# Patient Record
Sex: Female | Born: 1980 | Race: White | Hispanic: No | Marital: Single | Smoking: Never smoker
Health system: Southern US, Community
[De-identification: ages and names within clinical notes are randomized; demographics above are authoritative.]

## PROBLEM LIST (undated history)

## (undated) DIAGNOSIS — N879 Dysplasia of cervix uteri, unspecified: Secondary | ICD-10-CM

## (undated) DIAGNOSIS — B977 Papillomavirus as the cause of diseases classified elsewhere: Secondary | ICD-10-CM

## (undated) DIAGNOSIS — E119 Type 2 diabetes mellitus without complications: Secondary | ICD-10-CM

## (undated) HISTORY — DX: Dysplasia of cervix uteri, unspecified: N87.9

## (undated) HISTORY — DX: Papillomavirus as the cause of diseases classified elsewhere: B97.7

## (undated) HISTORY — DX: Type 2 diabetes mellitus without complications: E11.9

---

## 2008-04-11 DIAGNOSIS — N879 Dysplasia of cervix uteri, unspecified: Secondary | ICD-10-CM

## 2008-04-11 HISTORY — PX: LEEP: SHX91

## 2008-04-11 HISTORY — DX: Dysplasia of cervix uteri, unspecified: N87.9

## 2016-10-20 ENCOUNTER — Ambulatory Visit (INDEPENDENT_AMBULATORY_CARE_PROVIDER_SITE_OTHER): Payer: 59 | Admitting: Obstetrics and Gynecology

## 2016-10-20 ENCOUNTER — Encounter: Payer: Self-pay | Admitting: Obstetrics and Gynecology

## 2016-10-20 ENCOUNTER — Ambulatory Visit: Payer: Self-pay | Admitting: Obstetrics and Gynecology

## 2016-10-20 VITALS — BP 120/80 | HR 62 | Ht 70.0 in | Wt 239.0 lb

## 2016-10-20 DIAGNOSIS — Z8041 Family history of malignant neoplasm of ovary: Secondary | ICD-10-CM

## 2016-10-20 DIAGNOSIS — Z124 Encounter for screening for malignant neoplasm of cervix: Secondary | ICD-10-CM | POA: Diagnosis not present

## 2016-10-20 DIAGNOSIS — N6459 Other signs and symptoms in breast: Secondary | ICD-10-CM

## 2016-10-20 DIAGNOSIS — Z01419 Encounter for gynecological examination (general) (routine) without abnormal findings: Secondary | ICD-10-CM

## 2016-10-20 DIAGNOSIS — R350 Frequency of micturition: Secondary | ICD-10-CM

## 2016-10-20 DIAGNOSIS — Z1151 Encounter for screening for human papillomavirus (HPV): Secondary | ICD-10-CM | POA: Diagnosis not present

## 2016-10-20 DIAGNOSIS — N649 Disorder of breast, unspecified: Secondary | ICD-10-CM

## 2016-10-20 LAB — POCT URINALYSIS DIPSTICK
BILIRUBIN UA: NEGATIVE
Blood, UA: NEGATIVE
Glucose, UA: NEGATIVE
KETONES UA: NEGATIVE
LEUKOCYTES UA: NEGATIVE
NITRITE UA: NEGATIVE
PH UA: 6 (ref 5.0–8.0)
Protein, UA: NEGATIVE
Spec Grav, UA: 1.02 (ref 1.010–1.025)
Urobilinogen, UA: NEGATIVE E.U./dL

## 2016-10-20 NOTE — Progress Notes (Signed)
Chief Complaint  Patient presents with  . Gynecologic Exam     HPI:      Ms. Tasha Rodriguez is a 36 y.o. [id number] who LMP was Patient's last menstrual period was 10/02/2016., presents today for her NP annual examination.  Her menses are regular every 28-30 days, lasting 5 days.  Dysmenorrhea none. She does not have intermenstrual bleeding.  Sex activity: single partner, contraception - none. Conception ok. Pt taking PNVs. Last Pap: not recent  Hx of STDs: HPV, s/p LEEP 2010; neg paps since. No other hx of STDs.  There is no FH of breast cancer. There is a possible FH of ovarian cancer in her mom, although she never had chemo/rad. Question cx. Pt unsure. The patient does do self-breast exams. She notes a bump that develops on her RT areola occas that has a white d/c when squeezed, like a pimple. Sx then resolve and recur. Occas happens on LT breast.  Tobacco use: The patient denies current or previous tobacco use. Alcohol use: social drinker Exercise: moderately active  She does get adequate calcium but not Vitamin D in her diet. She complains of urinary frequency with and without good flow for several wks. She denies any dysuria/hematuria. She drinks caffeine. She sits all day at work.  Past Medical History:  Diagnosis Date  . Cervical dysplasia 2010  . HPV (human papilloma virus) infection     Past Surgical History:  Procedure Laterality Date  . CESAREAN SECTION    . LEEP  2010    Family History  Problem Relation Age of Onset  . Diabetes Mother   . Ovarian cancer Mother 34       s/p hyst, no chemo/rad; question cx  . Diabetes Father   . Liver disease Brother   . Diabetes Maternal Aunt   . Diabetes Maternal Uncle   . Diabetes Maternal Grandmother   . Diabetes Maternal Grandfather     Social History   Social History  . Marital status: Single    Spouse name: N/A  . Number of children: N/A  . Years of education: N/A   Occupational History  . Not on  file.   Social History Main Topics  . Smoking status: Never Smoker  . Smokeless tobacco: Never Used  . Alcohol use Yes  . Drug use: No  . Sexual activity: Yes    Birth control/ protection: None   Other Topics Concern  . Not on file   Social History Narrative  . No narrative on file    No current outpatient prescriptions on file.  ROS:  Review of Systems  Constitutional: Positive for fatigue. Negative for fever and unexpected weight change.  Respiratory: Negative for cough, shortness of breath and wheezing.   Cardiovascular: Negative for chest pain, palpitations and leg swelling.  Gastrointestinal: Positive for constipation. Negative for blood in stool, diarrhea, nausea and vomiting.  Endocrine: Negative for cold intolerance, heat intolerance and polyuria.  Genitourinary: Positive for frequency. Negative for dyspareunia, dysuria, flank pain, genital sores, hematuria, menstrual problem, pelvic pain, urgency, vaginal bleeding, vaginal discharge and vaginal pain.  Musculoskeletal: Negative for back pain, joint swelling and myalgias.  Skin: Negative for rash.  Neurological: Negative for dizziness, syncope, light-headedness, numbness and headaches.  Hematological: Negative for adenopathy.  Psychiatric/Behavioral: Negative for agitation, confusion, sleep disturbance and suicidal ideas. The patient is not nervous/anxious.      Objective: BP 120/80   Pulse 62   Ht 5\' 10"  (1.778 m)   Wt  239 lb (108.4 kg)   LMP 10/02/2016   BMI 34.29 kg/m    Physical Exam  Constitutional: She is oriented to person, place, and time. She appears well-developed and well-nourished.  Genitourinary: Vagina normal and uterus normal. There is no rash or tenderness on the right labia. There is no rash or tenderness on the left labia. No erythema or tenderness in the vagina. No vaginal discharge found. Right adnexum does not display mass and does not display tenderness. Left adnexum does not display mass  and does not display tenderness. Cervix does not exhibit motion tenderness or polyp. Uterus is not enlarged or tender.  Neck: Normal range of motion. No thyromegaly present.  Cardiovascular: Normal rate, regular rhythm and normal heart sounds.   No murmur heard. Pulmonary/Chest: Effort normal and breath sounds normal. Right breast exhibits inverted nipple. Right breast exhibits no mass, no nipple discharge, no skin change and no tenderness. Left breast exhibits inverted nipple. Left breast exhibits no mass, no nipple discharge, no skin change and no tenderness.  BILAT NIPPLE INVERTED AND NO D/C FROM THEM; NO AREOLAR LESIONS TODAY BUT AREA OF CONCERN FOR PT IS C/W MONTGOMERY GLAND BLOCKAGE  Abdominal: Soft. There is no tenderness. There is no guarding.  Musculoskeletal: Normal range of motion.  Neurological: She is alert and oriented to person, place, and time. No cranial nerve deficit.  Psychiatric: She has a normal mood and affect. Her behavior is normal.  Vitals reviewed.   Results: Results for orders placed or performed in visit on 10/20/16 (from the past 24 hour(s))  POCT Urinalysis Dipstick     Status: Normal   Collection Time: 10/20/16  8:58 AM  Result Value Ref Range   Color, UA yellow    Clarity, UA clear    Glucose, UA neg    Bilirubin, UA neg    Ketones, UA neg    Spec Grav, UA 1.020 1.010 - 1.025   Blood, UA neg    pH, UA 6.0 5.0 - 8.0   Protein, UA neg    Urobilinogen, UA negative 0.2 or 1.0 E.U./dL   Nitrite, UA neg    Leukocytes, UA Negative Negative    Assessment/Plan: Encounter for annual routine gynecological examination  Cervical cancer screening - Plan: IGP, Aptima HPV  Screening for HPV (human papillomavirus) - Plan: IGP, Aptima HPV  Urinary frequency - Neg dip. Decrease caffeine. F/u prn.  - Plan: POCT Urinalysis Dipstick  Breast complaint - No nipple d/c/no masses. Area of concern most c/w montgomery gland blockage. Warm compresses prn. F/u prn.    Family history of ovarian cancer - Pt to clarify dx in mom because it sounds more cx and not ovarian. If ovarian, discussed cancer genetic testing.              GYN counsel adequate intake of calcium and vitamin D     F/U  Return in about 1 year (around 10/20/2017).  Tasha Rodriguez B. Daisia Slomski, PA-C 10/20/2016 9:01 AM

## 2016-10-27 LAB — IGP, APTIMA HPV
HPV APTIMA: NEGATIVE
PAP SMEAR COMMENT: 0

## 2016-11-23 ENCOUNTER — Emergency Department
Admission: EM | Admit: 2016-11-23 | Discharge: 2016-11-23 | Disposition: A | Discharge status: Home / Self Care | Payer: 59 | Attending: Emergency Medicine | Admitting: Emergency Medicine

## 2016-11-23 ENCOUNTER — Emergency Department: Payer: 59

## 2016-11-23 ENCOUNTER — Encounter: Payer: Self-pay | Admitting: Emergency Medicine

## 2016-11-23 DIAGNOSIS — O26891 Other specified pregnancy related conditions, first trimester: Secondary | ICD-10-CM

## 2016-11-23 DIAGNOSIS — O200 Threatened abortion: Secondary | ICD-10-CM | POA: Diagnosis not present

## 2016-11-23 DIAGNOSIS — R1030 Lower abdominal pain, unspecified: Secondary | ICD-10-CM | POA: Diagnosis not present

## 2016-11-23 DIAGNOSIS — O209 Hemorrhage in early pregnancy, unspecified: Secondary | ICD-10-CM

## 2016-11-23 DIAGNOSIS — Z3A01 Less than 8 weeks gestation of pregnancy: Secondary | ICD-10-CM | POA: Insufficient documentation

## 2016-11-23 DIAGNOSIS — R102 Pelvic and perineal pain: Secondary | ICD-10-CM

## 2016-11-23 LAB — COMPREHENSIVE METABOLIC PANEL
ALT: 38 U/L (ref 14–54)
AST: 29 U/L (ref 15–41)
Albumin: 4.2 g/dL (ref 3.5–5.0)
Alkaline Phosphatase: 60 U/L (ref 38–126)
Anion gap: 8 (ref 5–15)
BUN: 12 mg/dL (ref 6–20)
CHLORIDE: 105 mmol/L (ref 101–111)
CO2: 26 mmol/L (ref 22–32)
CREATININE: 0.69 mg/dL (ref 0.44–1.00)
Calcium: 9 mg/dL (ref 8.9–10.3)
GFR calc Af Amer: >60 mL/min (ref >60)
Glucose, Bld: 105 mg/dL — ABNORMAL HIGH (ref 65–99)
Potassium: 3.8 mmol/L (ref 3.5–5.1)
Sodium: 139 mmol/L (ref 135–145)
Total Bilirubin: 0.8 mg/dL (ref 0.3–1.2)
Total Protein: 7.4 g/dL (ref 6.5–8.1)

## 2016-11-23 LAB — CBC WITH DIFFERENTIAL/PLATELET
BASOS ABS: 0.1 10*3/uL (ref 0–0.1)
Basophils Relative: 1 %
EOS PCT: 1 %
Eosinophils Absolute: 0.2 10*3/uL (ref 0–0.7)
HEMATOCRIT: 38.9 % (ref 35.0–47.0)
Hemoglobin: 13.3 g/dL (ref 12.0–16.0)
LYMPHS ABS: 2.2 10*3/uL (ref 1.0–3.6)
LYMPHS PCT: 17 %
MCH: 29.9 pg (ref 26.0–34.0)
MCHC: 34.2 g/dL (ref 32.0–36.0)
MCV: 87.6 fL (ref 80.0–100.0)
MONO ABS: 0.7 10*3/uL (ref 0.2–0.9)
MONOS PCT: 6 %
NEUTROS ABS: 9.8 10*3/uL — AB (ref 1.4–6.5)
Neutrophils Relative %: 75 %
PLATELETS: 231 10*3/uL (ref 150–440)
RBC: 4.44 MIL/uL (ref 3.80–5.20)
RDW: 12.6 % (ref 11.5–14.5)
WBC: 12.9 10*3/uL — ABNORMAL HIGH (ref 3.6–11.0)

## 2016-11-23 LAB — ABO/RH: ABO/RH(D): B POS

## 2016-11-23 LAB — HCG, QUANTITATIVE, PREGNANCY: HCG, BETA CHAIN, QUANT, S: 65 m[IU]/mL — AB (ref <5)

## 2016-11-23 NOTE — ED Notes (Signed)
Instructions reviewed.  Agrees to call [facility] today about setting up hcg testing.  Signature pad not working.  Return precautions reviewed with good understanding.

## 2016-11-23 NOTE — ED Notes (Signed)
Pt returned from US at this time.

## 2016-11-23 NOTE — ED Provider Notes (Signed)
[facility] Emergency Department Provider Note       Time seen: ----------------------------------------- 10:09 AM on 11/23/2016 -----------------------------------------     I have reviewed the triage vital signs and the nursing notes.   HISTORY   Chief Complaint Vaginal Bleeding    HPI Tasha Rodriguez is a 36 y.o. female who presents to the ED for abdominal pain and cramping with vaginal bleeding. Patient reports she's approximate [redacted] weeks pregnant. She is G3 P2 Ab0. She does describe heavy bleeding like a menstrual cycle and 4 out of 10 cramping pain in the lower abdomen.   Past Medical History:  Diagnosis Date  . Cervical dysplasia 2010  . HPV (human papilloma virus) infection     There are no active problems to display for this patient.   Past Surgical History:  Procedure Laterality Date  . CESAREAN SECTION    . LEEP  2010    Allergies Patient has no known allergies.  Social History Social History  Substance Use Topics  . Smoking status: Never Smoker  . Smokeless tobacco: Never Used  . Alcohol use Yes   Review of Systems Constitutional: Negative for fever. Eyes: Negative for vision changes ENT:  Negative for congestion, sore throat Cardiovascular: Negative for chest pain. Respiratory: Negative for shortness of breath. Gastrointestinal: Positive for abdominal pain Genitourinary: Positive for vaginal bleeding Musculoskeletal: Negative for back pain. Skin: Negative for rash. Neurological: Negative for headaches, focal weakness or numbness.  All systems negative/normal/unremarkable except as stated in the HPI  ____________________________________________   PHYSICAL EXAM:  VITAL SIGNS: ED Triage Vitals [11/23/16 0905]  Enc Vitals Group     BP (!) 128/45     Pulse Rate 69     Resp 18     Temp (!) 97.2 F (36.2 C)     Temp Source Oral     SpO2 100 %     Weight 240 lb (108.9 kg)     Height 5\' 10"  (1.778 m)     Head  Circumference      Peak Flow      Pain Score 4     Pain Loc      Pain Edu?      Excl. in GC?     Constitutional: Alert and oriented. Well appearing and in no distress. Eyes: Conjunctivae are normal. Normal extraocular movements. ENT   Head: Normocephalic and atraumatic.   Nose: No congestion/rhinnorhea.   Mouth/Throat: Mucous membranes are moist.   Neck: No stridor. Cardiovascular: Normal rate, regular rhythm. No murmurs, rubs, or gallops. Respiratory: Normal respiratory effort without tachypnea nor retractions. Breath sounds are clear and equal bilaterally. No wheezes/rales/rhonchi. Gastrointestinal: Soft and nontender. Normal bowel sounds Musculoskeletal: Nontender with normal range of motion in extremities. No lower extremity tenderness nor edema. Neurologic:  Normal speech and language. No gross focal neurologic deficits are appreciated.  Skin:  Skin is warm, dry and intact. No rash noted. Psychiatric: Mood and affect are normal. Speech and behavior are normal.   ____________________________________________  ED COURSE:  Pertinent labs & imaging results that were available during my care of the patient were reviewed by me and considered in my medical decision making (see chart for details). Patient presents for abdominal cramping and vaginal bleeding in pregnancy, we will assess with labs and imaging as indicated. Clinical Course as of Nov 24 1215  Wed Nov 23, 2016  1048 HCG, Clement Sayres, [place]: (!) 65 [MJ]    Clinical Course User Index [MJ] Lorin Mercy  D, Student-PA   Procedures ____________________________________________   LABS (pertinent positives/negatives)  Labs Reviewed  HCG, QUANTITATIVE, PREGNANCY - Abnormal; Notable for the following:       Result Value   hCG, Beta Chain, Quant, S 65 (*)    All other components within normal limits  CBC WITH DIFFERENTIAL/PLATELET - Abnormal; Notable for the following:    WBC 12.9 (*)    Neutro Abs 9.8  (*)    All other components within normal limits  COMPREHENSIVE METABOLIC PANEL - Abnormal; Notable for the following:    Glucose, Bld 105 (*)    All other components within normal limits  ABO/RH    RADIOLOGY Images were viewed by me  Pregnancy ultrasound IMPRESSION: 1. No intrauterine gestational sac seen. Findings could represent early intrauterine pregnancy, failed pregnancy, or ectopic pregnancy. Recommend follow-up quantitative B-HCG levels and follow-up US in 10-14 days. This recommendation follows SRU consensus guidelines: Diagnostic Criteria for Nonviable Pregnancy Early in the First Trimester. Malva Limes Engl J Med 2013; 829:5621-30; 369:1443-51. 2. A 6 mm hyperechoic lesion in the right ovary may represent a small dermoid. 3. Fibroid uterus. ____________________________________________  FINAL ASSESSMENT AND PLAN  Threatened miscarriage  Plan: Patient's labs and imaging were dictated above. Patient had presented for Vaginal bleeding in pregnancy and likely miscarriage. She has had heavy bleeding and ultrasound findings are negative for intrauterine gestation. She'll be encouraged to follow-up for 2 days for repeat hCG.   Emily FilbertWilliams, Aymee Fomby E, MD   Note: This note was generated in part or whole with voice recognition software. Voice recognition is usually quite accurate but there are transcription errors that can and very often do occur. I apologize for any typographical errors that were not detected and corrected.     Emily FilbertWilliams, Sharlyne Koeneman E, MD 11/23/16 [phone]

## 2016-11-23 NOTE — ED Notes (Signed)
Pt transported to US at this time. 

## 2016-11-23 NOTE — ED Triage Notes (Signed)
Pt to ed with c/o abd pain and cramping and vaginal bleeding. Pt reports she is approx [redacted] weeks pregnant.

## 2016-12-05 ENCOUNTER — Encounter: Payer: Self-pay | Admitting: Obstetrics & Gynecology

## 2016-12-05 ENCOUNTER — Ambulatory Visit (INDEPENDENT_AMBULATORY_CARE_PROVIDER_SITE_OTHER): Payer: 59 | Admitting: Obstetrics & Gynecology

## 2016-12-05 VITALS — BP 124/82 | Wt 242.0 lb

## 2016-12-05 DIAGNOSIS — O209 Hemorrhage in early pregnancy, unspecified: Secondary | ICD-10-CM | POA: Diagnosis not present

## 2016-12-05 NOTE — Patient Instructions (Signed)
Pregnancy Test Information °What is a pregnancy test? °A pregnancy test is used to detect the presence of human chorionic gonadotropin (hCG) in a sample of your urine or blood. hCG is a hormone produced by the cells of the placenta. The placenta is the organ that forms to nourish and support a developing baby. °This test requires a sample of either blood or urine. A pregnancy test determines whether you are pregnant or not. °How are pregnancy tests done? °Pregnancy tests are done using a home pregnancy test or having a blood or urine test done at your health care provider's office. °Home pregnancy tests require a urine sample. °· Most kits use a plastic testing device with a strip of paper that indicates whether there is hCG in your urine. °· Follow the test instructions very carefully. °· After you urinate on the test stick, markings will appear to let you know whether you are pregnant. °· For best results, use your first urine of the morning. That is when the concentration of hCG is highest. ° °Having a blood test to check for pregnancy requires a sample of blood drawn from a vein in your hand or arm. Your health care provider will send your sample to a lab for testing. Results of a pregnancy test will be positive or negative. °Is one type of pregnancy test better than another? °In some cases, a blood test will return a positive result even if a urine test was negative because blood tests are more sensitive. This means blood tests can detect hCG earlier than home pregnancy tests. °How accurate are home pregnancy tests? °Both types of pregnancy tests are very accurate. °· A blood test is about 98% accurate. °· When you are far enough along in your pregnancy and when used correctly, home pregnancy tests are equally accurate. ° °Can anything interfere with home pregnancy test results °It is possible for certain conditions to cause an inaccurate test result (false positive or false negative). °· A false positive is a  positive test result when you are not pregnant. This can happen if you: °? Are taking certain medicines, including anticonvulsants or tranquilizers. °? Have certain proteins in your blood. °· A false negative is a negative test result when you are pregnant. This can happen if you: °? Took the test before there was enough hCG to detect. A pregnancy test will not be positive in most women until 3-4 weeks after conception. °? Drank a lot of liquid before the test. Diluted urine samples can sometimes give an inaccurate result. °? Take certain medicines, such as water pills (diuretics) or some antihistamines. ° °What should I do if I have a positive pregnancy test? °If you have a positive pregnancy test, schedule an appointment with your health care provider. You might need additional testing to confirm the pregnancy. In the meantime, begin taking a prenatal vitamin, stop smoking, stop drinking alcohol, and do not use street drugs. °Talk to your health care provider about how to take care of yourself during your pregnancy. Ask about what to expect from the care you will need throughout pregnancy (prenatal care). °This information is not intended to replace advice given to you by your health care provider. Make sure you discuss any questions you have with your health care provider. °Document Released: 03/31/2003 Document Revised: 02/23/2016 Document Reviewed: 07/23/2013 °Elsevier Interactive Patient Education © 2017 Elsevier Inc. ° °

## 2016-12-05 NOTE — Progress Notes (Signed)
Obstetric Problem Visit   Chief Complaint: Believes she miscarried on the 15th  History of Present Illness: Patient is a 36 y.o. [id number] [redacted]w[redacted]d presenting for first trimester bleeding.  The onset of bleeding was on the 14th (13 days ago) and she bled for 3 days.  Had beta hCG in [facility] of [facility] and no US findings of concern or of pregnancy.  Still has nuasea and h/a and urin freq; no longer has breast T. Prior CS x2.  Prior LEEP. Does not tol hormone BC well so has not taken since 20.  Is bleeding equal to or greater than normal menstrual flow:  No Any recent trauma:  No Recent intercourse:  No History of prior miscarriage:  No Prior ultrasound demonstrating IUP:  No Prior ultrasound demonstrating viable IUP:  No Prior Serum HCG:  Yes RH- B+  PMHx: She  has a past medical history of Cervical dysplasia (2010) and HPV (human papilloma virus) infection. Also,  has a past surgical history that includes LEEP (2010) and Cesarean section., family history includes Diabetes in her father, maternal aunt, maternal grandfather, maternal grandmother, maternal uncle, and mother; Liver disease in her brother; Ovarian cancer (age of onset: 2) in her mother.,  reports that she has never smoked. She has never used smokeless tobacco. She reports that she drinks alcohol. She reports that she does not use drugs.  She has a current medication list which includes the following prescription(s): cholecalciferol and prenatal multivitamin. Also, has No Known Allergies.  Review of Systems  Constitutional: Negative for chills, fever and malaise/fatigue.  HENT: Negative for congestion, sinus pain and sore throat.   Eyes: Negative for blurred vision and pain.  Respiratory: Negative for cough and wheezing.   Cardiovascular: Negative for chest pain and leg swelling.  Gastrointestinal: Negative for abdominal pain, constipation, diarrhea, heartburn, nausea and vomiting.  Genitourinary: Negative for dysuria, frequency,  hematuria and urgency.  Musculoskeletal: Negative for back pain, joint pain, myalgias and neck pain.  Skin: Negative for itching and rash.  Neurological: Negative for dizziness, tremors and weakness.  Endo/Heme/Allergies: Does not bruise/bleed easily.  Psychiatric/Behavioral: Negative for depression. The patient is not nervous/anxious and does not have insomnia.     Objective: Vitals:   12/05/16 1026  BP: 124/82   Physical Exam  Constitutional: She is oriented to person, place, and time. She appears well-developed and well-nourished. No distress.  Musculoskeletal: Normal range of motion.  Neurological: She is alert and oriented to person, place, and time.  Skin: Skin is warm and dry.  Psychiatric: She has a normal mood and affect.  Vitals reviewed.   Assessment: 36 y.o. G3P2002 [redacted]w[redacted]d 1. First trimester bleeding 2 weeks ago, resolved. - Beta HCG, Quant   Plan: Problem List Items Addressed This Visit      Other   First trimester bleeding - Primary   Relevant Orders   Beta HCG, Quant      1) First trimester bleeding - incidence and clinical course of first trimester bleeding is discussed in detail with the patient today.  Approximately 1/3 of pregnancies ending in live births experienced 1st trimester bleeding.  The amount of bleeding is variable and not necessarily predictive of outcome.  Sources may be cervical or uterine.  Subchorionic hemorrhages are a frequent concurrent findings on ultrasound and are followed expectantly.  These often absorb or regress spontaneously although risk for expansion and further disruption of the utero-placental interface leading to miscarriage is possible.  There is no clearly documented benefit  to limiting or modifying activity and sexual intercourse in altering clinic course of 1st trimester bleeding.    2) Based on hCG levels, will proceed with TVUS evaluation to document viability, and if uncertain viability or absence of a demonstrable IUP  (and no previous documentation of IUP) will trend HCG levels.  3) The patient is Rh +, rhogam is therefore not indicated to decrease the risk rhesus alloimmunization.    4) Routine bleeding precautions were discussed with the patient prior the conclusion of today's visit.  5) No BC desired (hormonal, Paraguard discussed) if miscarriage  Annamarie Major, MD, [facility], [facility] 12/05/2016  10:47 AM

## 2016-12-06 ENCOUNTER — Telehealth: Payer: Self-pay | Admitting: Obstetrics & Gynecology

## 2016-12-06 LAB — BETA HCG QUANT (REF LAB): hCG Quant: <1 m[IU]/mL

## 2016-12-06 NOTE — Telephone Encounter (Signed)
Pt returning phone call for test results. Pt stated it's ok to leave detailed message if she can't get to the phone. She is aware that may not be possible but would relay the message.

## 2017-10-01 ENCOUNTER — Encounter (HOSPITAL_COMMUNITY): Payer: Self-pay

## 2018-03-19 ENCOUNTER — Encounter: Payer: Self-pay | Admitting: Certified Nurse Midwife

## 2018-03-27 ENCOUNTER — Encounter: Payer: Self-pay | Admitting: Certified Nurse Midwife

## 2018-04-03 ENCOUNTER — Encounter: Payer: Self-pay | Admitting: Certified Nurse Midwife

## 2018-04-11 NOTE — L&D Delivery Note (Signed)
° ° °    OP NOTE  Date: 10/15/2018   8:01 AM Name Tasha Rodriguez MR# [id number]  Preoperative Diagnosis: 1. Intrauterine pregnancy at [redacted]w[redacted]d Active Problems:   Decreased fetal movement affecting management of mother, antepartum   Non-reassuring fetal heart rate or rhythm affecting management of mother   Non-reassuring electronic fetal monitoring tracing  2.  Previous cesarean delivery x2 -patient desires repeat  3.  Gestational diabetes  Postoperative Diagnosis: 1. Intrauterine pregnancy at [redacted]w[redacted]d, delivered 2. Viable infant 3. Remainder same as pre-op   Procedure: 1.  Repeat low-Transverse Cesarean Section  Surgeon: Finis Bud, MD  Assistant:    Anesthesia:  Spinal   EBL: 750  ml     Findings: 1) female infant, Apgar scores of 7   at 1 minute and 8   at 5 minutes and a birthweight of 70.9  ounces.    2) Normal uterus, tubes and ovaries.    Procedure:  The patient was prepped and draped in the supine position and placed under spinal anesthesia.  A transverse incision was made across the abdomen in a Pfannenstiel manner. If indicated the old scar was systematically removed with sharp dissection.  We carried the dissection down to the level of the fascia.  The fascia was incised in a curvilinear manner.  The fascia was then elevated from the rectus muscles with blunt and sharp dissection.  The rectus muscles were separated laterally exposing the peritoneum.  The peritoneum was carefully entered with care being taken to avoid bowel and bladder.  A self-retaining retractor was placed.  The visceral peritoneum was incised in a curvilinear fashion across the lower uterine segment creating a bladder flap. A transverse incision was made across the lower uterine segment and extended laterally and superiorly using the bandage scissors.  Artificial rupture membranes was performed and Clear fluid was noted.  The infant was delivered from the cephalic position.  A nuchal cord was  not present. After an appropriate time interval, the cord was doubly clamped and cut. Cord blood was obtained if required.  The infant was handed to the pediatric personnel  who then placed the infant under heat lamps where it was cleaned dried and suctioned as needed. The placenta was delivered. The hysterotomy incision was then identified on ring forceps.  The uterine cavity was cleaned with a moist lap sponge.  The hysterotomy incision was closed with a running interlocking suture of Vicryl.  Hemostasis was excellent.  Pitocin was run in the IV and the uterus was found to be firm. The posterior cul-de-sac and gutters were cleaned and inspected.  Hemostasis was noted.  The fascia was then closed with a running suture of #1 Vicryl.  Hemostasis of the subcutaneous tissues was obtained using the Bovie.  The subcutaneous tissues were closed with a running suture of 000 Vicryl.  A subcuticular suture was placed.  Steri-Strips were applied in the usual manner.  A pressure dressing was placed.  The patient went to the recovery room in stable condition.   Finis Bud, M.D. 10/15/2018 8:01 AM

## 2018-04-17 NOTE — Progress Notes (Signed)
Tasha Rodriguez presents for NOB visit. Pregnancy confirmation done [facility] 1/8/20_____.  G-4 .  P-2 0 1 2    . Pregnancy education material explained and given. _2_ cats in the home. NOB labs ordered. (TSH/HbgA1c due to Increased BMI). HIV labs and Drug screen were explained optional and she did not decline. Drug screen ordered. PNV encouraged. Genetic screening options discussed. Genetic testing:Declined.  Pt may discuss with provider. Pt. To follow up with provider in _4_ weeks.  All questions answered. Pt would like to follow with midwives.

## 2018-04-18 ENCOUNTER — Encounter: Payer: Self-pay | Admitting: Certified Nurse Midwife

## 2018-04-18 ENCOUNTER — Ambulatory Visit: Payer: Self-pay | Admitting: Certified Nurse Midwife

## 2018-04-18 VITALS — BP 103/69 | HR 84 | Ht 70.0 in | Wt 230.6 lb

## 2018-04-18 DIAGNOSIS — Z3481 Encounter for supervision of other normal pregnancy, first trimester: Secondary | ICD-10-CM

## 2018-04-18 LAB — POCT URINE PREGNANCY: Preg Test, Ur: POSITIVE — AB

## 2018-04-18 NOTE — Progress Notes (Signed)
NEW OB HISTORY AND PHYSICAL  SUBJECTIVE:       Tasha Rodriguez is a 38 y.o. [phone] female, Patient's last menstrual period was 01/23/2018 (approximate)., Estimated Date of Delivery: None noted., Unknown, presents today for establishment of Prenatal Care. She has no unusual complaints.     Gynecologic History Patient's last menstrual period was 01/23/2018 (approximate). Normal Contraception: none Last Pap:10/20/16. Results were: normal, HPB negative   Obstetric History OB History  Gravida Para Term Preterm AB Living  4 2 2   1 2   SAB TAB Ectopic Multiple Live Births  1       1    # Outcome Date GA Lbr Len/2nd Weight Sex Delivery Anes PTL Lv  4 Current           3 SAB 2018 [redacted]w[redacted]d         2 Term 2011   7 lb 5 oz (3.317 kg) F CS-Unspec  N LIV  1 Term 11/12/99   7 lb 9.6 oz (3.447 kg) F CS-Unspec       Past Medical History:  Diagnosis Date  . Cervical dysplasia 2010  . HPV (human papilloma virus) infection     Past Surgical History:  Procedure Laterality Date  . CESAREAN SECTION    . LEEP  2010    Current Outpatient Medications on File Prior to Visit  Medication Sig Dispense Refill  . cholecalciferol (VITAMIN D) 1000 units tablet Take 1,000 Units by mouth daily.    . Prenatal Vit-Fe Fumarate-FA (PRENATAL MULTIVITAMIN) TABS tablet Take 1 tablet by mouth daily at 12 noon.     No current facility-administered medications on file prior to visit.     No Known Allergies  Social History   Socioeconomic History  . Marital status: Single    Spouse name: Not on file  . Number of children: Not on file  . Years of education: Not on file  . Highest education level: Not on file  Occupational History  . Not on file  Social Needs  . Financial resource strain: Not on file  . Food insecurity:    Worry: Not on file    Inability: Not on file  . Transportation needs:    Medical: Not on file    Non-medical: Not on file  Tobacco Use  . Smoking status: Never Smoker  . Smokeless  tobacco: Never Used  Substance and Sexual Activity  . Alcohol use: Not Currently  . Drug use: No  . Sexual activity: Yes    Birth control/protection: None  Lifestyle  . Physical activity:    Days per week: Not on file    Minutes per session: Not on file  . Stress: Not on file  Relationships  . Social connections:    Talks on phone: Not on file    Gets together: Not on file    Attends religious service: Not on file    Active member of club or organization: Not on file    Attends meetings of clubs or organizations: Not on file    Relationship status: Not on file  . Intimate partner violence:    Fear of current or ex partner: Not on file    Emotionally abused: Not on file    Physically abused: Not on file    Forced sexual activity: Not on file  Other Topics Concern  . Not on file  Social History Narrative  . Not on file    Family History  Problem Relation Age of Onset  .  Diabetes Mother   . Ovarian cancer Mother 3       s/p hyst, no chemo/rad; question cx  . Diabetes Father   . Liver disease Brother   . Diabetes Maternal Aunt   . Diabetes Maternal Uncle   . Diabetes Maternal Grandmother   . Diabetes Maternal Grandfather     The following portions of the patient's history were reviewed and updated as appropriate: allergies, current medications, past OB history, past medical history, past surgical history, past family history, past social history, and problem list.  She exercises 5 days a week Denies smoking , alcohol and drugs  OBJECTIVE: Initial Physical Exam (New OB)  GENERAL APPEARANCE: alert, well appearing, in no apparent distress, oriented to person, place and time, overweight HEAD: normocephalic, atraumatic MOUTH: mucous membranes moist, pharynx normal without lesions THYROID: no thyromegaly or masses present BREASTS: no masses noted, no significant tenderness, no palpable axillary nodes, no skin changes LUNGS: clear to auscultation, no wheezes, rales or  rhonchi, symmetric air entry HEART: regular rate and rhythm, no murmurs ABDOMEN: soft, nontender, nondistended, no abnormal masses, no epigastric pain EXTREMITIES: no redness or tenderness in the calves or thighs, no edema, no limitation in range of motion, intact peripheral pulses SKIN: normal coloration and turgor, no rashes LYMPH NODES: no adenopathy palpable NEUROLOGIC: alert, oriented, normal speech, no focal findings or movement disorder noted  PELVIC EXAM EXTERNAL GENITALIA: normal appearing vulva with no masses, tenderness or lesions VAGINA: no abnormal discharge or lesions CERVIX: no lesions or cervical motion tenderness, hx of leep  UTERUS: gravid ADNEXA: no masses palpable and nontender OB EXAM PELVIMETRY: appears adequate RECTUM: exam not indicated  ASSESSMENT: Normal pregnancy  PLAN: New OB counseling: pt has history 2 previous c/s she plans to have a repeat c/s but would really like to see the midwives for her [facility].  The patient has been given an overview regarding routine prenatal care. Recommendations regarding diet, weight gain, and exercise in pregnancy were given.Prenatal testing, optional genetic testing,,carrier screening and ultrasound use in pregnancy were reviewed. Pt declines genetic testing Benefits of Breast Feeding were discussed. PT states she has inverted nipples and has never been able to get good latch so she pumped but she would like to try to nurse. The patient is encouraged to consider nursing her baby post partum.   Doreene Burke, CNM

## 2018-04-18 NOTE — Patient Instructions (Signed)

## 2018-04-19 LAB — ABO AND RH: Rh Factor: POSITIVE

## 2018-04-19 LAB — CBC WITH DIFFERENTIAL
Basophils Absolute: 0.1 10*3/uL (ref 0.0–0.2)
Basos: 1 %
EOS (ABSOLUTE): 0.1 10*3/uL (ref 0.0–0.4)
EOS: 1 %
HEMATOCRIT: 38.7 % (ref 34.0–46.6)
Hemoglobin: 12.8 g/dL (ref 11.1–15.9)
Immature Grans (Abs): 0 10*3/uL (ref 0.0–0.1)
Immature Granulocytes: 0 %
LYMPHS ABS: 1.4 10*3/uL (ref 0.7–3.1)
Lymphs: 13 %
MCH: 29.3 pg (ref 26.6–33.0)
MCHC: 33.1 g/dL (ref 31.5–35.7)
MCV: 89 fL (ref 79–97)
MONOS ABS: 0.4 10*3/uL (ref 0.1–0.9)
Monocytes: 4 %
NEUTROS ABS: 8.8 10*3/uL — AB (ref 1.4–7.0)
NEUTROS PCT: 81 %
RBC: 4.37 x10E6/uL (ref 3.77–5.28)
RDW: 12.1 % (ref 11.7–15.4)
WBC: 10.7 10*3/uL (ref 3.4–10.8)

## 2018-04-19 LAB — URINALYSIS, ROUTINE W REFLEX MICROSCOPIC
Bilirubin, UA: NEGATIVE
Glucose, UA: NEGATIVE
KETONES UA: NEGATIVE
LEUKOCYTES UA: NEGATIVE
Nitrite, UA: NEGATIVE
PROTEIN UA: NEGATIVE
RBC, UA: NEGATIVE
Specific Gravity, UA: 1.008 (ref 1.005–1.030)
Urobilinogen, Ur: 0.2 mg/dL (ref 0.2–1.0)
pH, UA: 7.5 (ref 5.0–7.5)

## 2018-04-19 LAB — GC/CHLAMYDIA PROBE AMP
Chlamydia trachomatis, NAA: NEGATIVE
Neisseria gonorrhoeae by PCR: NEGATIVE

## 2018-04-19 LAB — HEMOGLOBIN A1C
ESTIMATED AVERAGE GLUCOSE: 120 mg/dL
HEMOGLOBIN A1C: 5.8 % — AB (ref 4.8–5.6)

## 2018-04-19 LAB — VARICELLA ZOSTER ANTIBODY, IGG: Varicella zoster IgG: <135 {index} — ABNORMAL LOW (ref >165)

## 2018-04-19 LAB — NICOTINE SCREEN, URINE: Cotinine Ql Scrn, Ur: NEGATIVE ng/mL

## 2018-04-19 LAB — SYPHILIS: RPR W/REFLEX TO RPR TITER AND TREPONEMAL ANTIBODIES, TRADITIONAL SCREENING AND DIAGNOSIS ALGORITHM: RPR Ser Ql: NONREACTIVE

## 2018-04-19 LAB — ANTIBODY SCREEN: Antibody Screen: NEGATIVE

## 2018-04-19 LAB — HGB SOLU + RFLX FRAC: SICKLE SOLUBILITY TEST - HGBRFX: NEGATIVE

## 2018-04-19 LAB — HEPATITIS B SURFACE ANTIGEN: Hepatitis B Surface Ag: NEGATIVE

## 2018-04-19 LAB — RUBELLA SCREEN: Rubella Antibodies, IGG: 7.62 {index} (ref >0.99)

## 2018-04-19 LAB — TSH: TSH: 0.939 u[IU]/mL (ref 0.450–4.500)

## 2018-04-19 LAB — HIV ANTIBODY (ROUTINE TESTING W REFLEX): HIV Screen 4th Generation wRfx: NONREACTIVE

## 2018-04-20 LAB — URINE CULTURE

## 2018-04-23 LAB — MONITOR DRUG PROFILE 14(MW)
Amphetamine Scrn, Ur: NEGATIVE ng/mL
BARBITURATE SCREEN URINE: NEGATIVE ng/mL
BENZODIAZEPINE SCREEN, URINE: NEGATIVE ng/mL
BUPRENORPHINE, URINE: NEGATIVE ng/mL
Cocaine (Metab) Scrn, Ur: NEGATIVE ng/mL
Creatinine(Crt), U: 33.2 mg/dL (ref 20.0–300.0)
Fentanyl, Urine: NEGATIVE pg/mL
Meperidine Screen, Urine: NEGATIVE ng/mL
Methadone Screen, Urine: NEGATIVE ng/mL
OXYCODONE+OXYMORPHONE UR QL SCN: NEGATIVE ng/mL
Opiate Scrn, Ur: NEGATIVE ng/mL
PH UR, DRUG SCRN: 7 (ref 4.5–8.9)
PHENCYCLIDINE QUANTITATIVE URINE: NEGATIVE ng/mL
Propoxyphene Scrn, Ur: NEGATIVE ng/mL
SPECIFIC GRAVITY: 1.007
Tramadol Screen, Urine: NEGATIVE ng/mL

## 2018-04-23 LAB — CANNABINOID (GC/MS), URINE
CANNABINOID UR: POSITIVE — AB
Carboxy THC (GC/MS): 39 ng/mL

## 2018-04-26 ENCOUNTER — Ambulatory Visit (INDEPENDENT_AMBULATORY_CARE_PROVIDER_SITE_OTHER): Payer: Medicaid Other

## 2018-04-26 DIAGNOSIS — Z3687 Encounter for antenatal screening for uncertain dates: Secondary | ICD-10-CM | POA: Diagnosis not present

## 2018-04-26 DIAGNOSIS — Z3481 Encounter for supervision of other normal pregnancy, first trimester: Secondary | ICD-10-CM

## 2018-05-23 ENCOUNTER — Ambulatory Visit: Payer: 59 | Admitting: Certified Nurse Midwife

## 2018-05-23 ENCOUNTER — Encounter: Payer: Self-pay | Admitting: *Deleted

## 2018-05-23 VITALS — BP 121/67 | HR 92 | Wt 238.2 lb

## 2018-05-23 DIAGNOSIS — Z3493 Encounter for supervision of normal pregnancy, unspecified, third trimester: Secondary | ICD-10-CM

## 2018-05-23 LAB — POCT URINALYSIS DIPSTICK OB
Bilirubin, UA: NEGATIVE
Blood, UA: NEGATIVE
Glucose, UA: NEGATIVE
Ketones, UA: NEGATIVE
Leukocytes, UA: NEGATIVE
NITRITE UA: NEGATIVE
PH UA: 6 (ref 5.0–8.0)
POC,PROTEIN,UA: NEGATIVE
Spec Grav, UA: 1.015 (ref 1.010–1.025)
Urobilinogen, UA: 0.2 E.U./dL

## 2018-05-23 NOTE — Progress Notes (Signed)
ROB doing well. Discussed need for early glucose screen given history . She will return in as soon as she is able to complete it. Feels fluttering. Discussed anatomy scan and ROB in 3 wks. She verbalizes and agrees to plan.   Doreene Burke, CNM

## 2018-05-23 NOTE — Patient Instructions (Signed)
Glucose Tolerance Test During Pregnancy Why am I having this test? The glucose tolerance test (GTT) is done to check how your body processes sugar (glucose). This is one of several tests used to diagnose diabetes that develops during pregnancy (gestational diabetes mellitus). Gestational diabetes is a temporary form of diabetes that some women develop during pregnancy. It usually occurs during the second trimester of pregnancy and goes away after delivery. Testing (screening) for gestational diabetes usually occurs between 24 and 28 weeks of pregnancy. You may have the GTT test after having a 1-hour glucose screening test if the results from that test indicate that you may have gestational diabetes. You may also have this test if:  You have a history of gestational diabetes.  You have a history of giving birth to very large babies or have experienced repeated fetal loss (stillbirth).  You have signs and symptoms of diabetes, such as: ? Changes in your vision. ? Tingling or numbness in your hands or feet. ? Changes in hunger, thirst, and urination that are not otherwise explained by your pregnancy. What is being tested? This test measures the amount of glucose in your blood at different times during a period of 3 hours. This indicates how well your body is able to process glucose. What kind of sample is taken?  Blood samples are required for this test. They are usually collected by inserting a needle into a blood vessel. How do I prepare for this test?  For 3 days before your test, eat normally. Have plenty of carbohydrate-rich foods.  Follow instructions from your health care provider about: ? Eating or drinking restrictions on the day of the test. You may be asked to not eat or drink anything other than water (fast) starting 8-10 hours before the test. ? Changing or stopping your regular medicines. Some medicines may interfere with this test. Tell a health care provider about:  All  medicines you are taking, including vitamins, herbs, eye drops, creams, and over-the-counter medicines.  Any blood disorders you have.  Any surgeries you have had.  Any medical conditions you have. What happens during the test? First, your blood glucose will be measured. This is referred to as your fasting blood glucose, since you fasted before the test. Then, you will drink a glucose solution that contains a certain amount of glucose. Your blood glucose will be measured again 1, 2, and 3 hours after drinking the solution. This test takes about 3 hours to complete. You will need to stay at the testing location during this time. During the testing period:  Do not eat or drink anything other than the glucose solution.  Do not exercise.  Do not use any products that contain nicotine or tobacco, such as cigarettes and e-cigarettes. If you need help stopping, ask your health care provider. The testing procedure may vary among health care providers and hospitals. How are the results reported? Your results will be reported as milligrams of glucose per deciliter of blood (mg/dL) or millimoles per liter (mmol/L). Your health care provider will compare your results to normal ranges that were established after testing a large group of people (reference ranges). Reference ranges may vary among labs and hospitals. For this test, common reference ranges are:  Fasting: less than 95-105 mg/dL (5.3-5.8 mmol/L).  1 hour after drinking glucose: less than 180-190 mg/dL (10.0-10.5 mmol/L).  2 hours after drinking glucose: less than 155-165 mg/dL (8.6-9.2 mmol/L).  3 hours after drinking glucose: 140-145 mg/dL (7.8-8.1 mmol/L). What do the   results mean? Results within reference ranges are considered normal, meaning that your glucose levels are well-controlled. If two or more of your blood glucose levels are high, you may be diagnosed with gestational diabetes. If only one level is high, your health care  provider may suggest repeat testing or other tests to confirm a diagnosis. Talk with your health care provider about what your results mean. Questions to ask your health care provider Ask your health care provider, or the department that is doing the test:  When will my results be ready?  How will I get my results?  What are my treatment options?  What other tests do I need?  What are my next steps? Summary  The glucose tolerance test (GTT) is one of several tests used to diagnose diabetes that develops during pregnancy (gestational diabetes mellitus). Gestational diabetes is a temporary form of diabetes that some women develop during pregnancy.  You may have the GTT test after having a 1-hour glucose screening test if the results from that test indicate that you may have gestational diabetes. You may also have this test if you have any symptoms or risk factors for gestational diabetes.  Talk with your health care provider about what your results mean. This information is not intended to replace advice given to you by your health care provider. Make sure you discuss any questions you have with your health care provider. Document Released: 09/27/2011 Document Revised: 11/07/2016 Document Reviewed: 11/07/2016 Elsevier Interactive Patient Education  2019 Elsevier Inc.  

## 2018-05-23 NOTE — Progress Notes (Signed)
ROB- pt is doing well 

## 2018-05-25 ENCOUNTER — Other Ambulatory Visit: Payer: Self-pay

## 2018-05-25 DIAGNOSIS — Z3493 Encounter for supervision of normal pregnancy, unspecified, third trimester: Secondary | ICD-10-CM

## 2018-05-26 LAB — GLUCOSE, 1 HOUR GESTATIONAL: GESTATIONAL DIABETES SCREEN: 211 mg/dL — AB (ref 65–139)

## 2018-05-27 ENCOUNTER — Other Ambulatory Visit: Payer: Self-pay | Admitting: Certified Nurse Midwife

## 2018-05-27 DIAGNOSIS — O99810 Abnormal glucose complicating pregnancy: Secondary | ICD-10-CM

## 2018-05-27 NOTE — Progress Notes (Signed)
Early 1 hr screen 211. Order placed for lifestyles.   Doreene Burke, CNM

## 2018-05-29 ENCOUNTER — Ambulatory Visit: Payer: Self-pay | Admitting: *Deleted

## 2018-06-01 ENCOUNTER — Telehealth: Payer: Self-pay | Admitting: Dietician

## 2018-06-01 NOTE — Telephone Encounter (Signed)
Returned voicemail message from patient to schedule an appointment. Left message for her with available dates and times next week to reschedule her missed appointment from 05/29/18.

## 2018-06-13 ENCOUNTER — Encounter: Payer: Medicaid Other | Attending: Obstetrics and Gynecology | Admitting: Dietician

## 2018-06-13 ENCOUNTER — Encounter: Payer: Self-pay | Admitting: Dietician

## 2018-06-13 VITALS — Ht 69.0 in | Wt 231.8 lb

## 2018-06-13 DIAGNOSIS — Z713 Dietary counseling and surveillance: Secondary | ICD-10-CM | POA: Insufficient documentation

## 2018-06-13 DIAGNOSIS — O99810 Abnormal glucose complicating pregnancy: Secondary | ICD-10-CM | POA: Insufficient documentation

## 2018-06-13 DIAGNOSIS — O24410 Gestational diabetes mellitus in pregnancy, diet controlled: Secondary | ICD-10-CM

## 2018-06-13 DIAGNOSIS — Z6834 Body mass index (BMI) 34.0-34.9, adult: Secondary | ICD-10-CM | POA: Insufficient documentation

## 2018-06-13 NOTE — Progress Notes (Signed)
Medical Nutrition Therapy: Visit start time: 1010  end time: 1050  Assessment:  Diagnosis: Gestational diabetes Past medical history: none significant Psychosocial issues/ stress concerns: none  Preferred learning method:  . Auditory . Visual . Hands-on   Current weight: 231.8lbs Height: 5'9" EDC 10/30/18 Medications, supplements: pre-natal vitamins, vitamin D  Progress and evaluation: Patient reports she had GDM 8 years ago with previous pregnancy, with small amount of education; had elevated HbA1C of 5.8% 04/18/18 and 1-hour GTT result of 211mg /dl on 3/50/09. She reports a history of diabetes in her family. Patient was following a keto diet prior to pregnancy and had lost about 20lbs. Was previously following vegan eating pattern but was gaining weight.    Physical activity: elliptical and weight lifting 60 minutes 5x a week  Dietary Intake:  Usual eating pattern includes 3 meals and 2-3 snacks per day. Dining out frequency: 1 meals per week.  Breakfast: almonds and fruit (raspberries, blueberries), cheese; then goes to gym Snack: avocado with salsa and corn chips Lunch: [place] sandwich with spinach and tomatoes; crab sticks + veg Snack: popcorn or veg ie cucumber with ranch Supper: 7pm-- meat+ veg ie whole chicken with broccoli or brussels sprouts or carrots Snack: none; rarely ice cream Beverages: water, coffee black or with milk no sugar, mineral water flavored with lemon or unsweetened cran juice; occasional diet soda  Nutrition Care Education: Topics covered: gestational diabetes Basic nutrition: basic food groups, appropriate nutrient balance, appropriate meal and snack schedule, general nutrition guidelines    Weight control: slow weight gain during pregnancy Diabetes:  goals for BGs, appropriate meal and snack schedule, appropriate carb intake and balance, healthy carb choices, importance of protein and low-moderate fat intake; symptoms of hypoglycemia and appropriate  treatment.    Nutritional Diagnosis:  [place]-2.2 Altered nutrition-related laboratory As related to gestational diabetes.  As evidenced by patient with recent HbA1C of 5.8% and 1-hour GTT of 211mg /dl. [place]-3.3 Overweight/obesity As related to history of excess calories and inactivity.  As evidenced by patient report of overweight since childhood, with current BMI of 34 at about [redacted] weeks gestation.  Intervention:   Instruction as noted above.  Patient is making mostly healthy food choices and is limiting carbohydrate intake.   She is engaging in physical exercise on a regular basis.  No follow-up needed at this time; patient to call as needed if further RD intervention is required.    Education Materials given:  . General diet guidelines for Gestational Diabetes . Plate Planner with food lists . Goals/ instructions   Learner/ who was taught:  . Patient   Level of understanding: Marland Kitchen Verbalizes/ demonstrates competency  Demonstrated degree of understanding via:   Teach back Learning barriers: . None  Willingness to learn/ readiness for change: . Eager, change in progress   Monitoring and Evaluation:  Dietary intake, exercise, BG control, and body weight      follow up: prn

## 2018-06-13 NOTE — Patient Instructions (Signed)
   Continue with your healthy food choices and regular exercise, great job!  Monitor portions of carbohydrates, especially starchy foods.   If blood sugar drops low, drink 4oz of juice or regular soda, allow 10-15 minutes to get back to normal, then follow with a snack or meal that contains healthy carbs and protein.

## 2018-06-14 ENCOUNTER — Other Ambulatory Visit: Payer: Self-pay

## 2018-06-14 ENCOUNTER — Encounter: Payer: Self-pay | Admitting: Obstetrics and Gynecology

## 2018-06-21 ENCOUNTER — Encounter: Payer: Self-pay | Admitting: Obstetrics and Gynecology

## 2018-06-22 ENCOUNTER — Telehealth: Payer: Self-pay | Admitting: Obstetrics and Gynecology

## 2018-06-22 ENCOUNTER — Encounter: Payer: Medicaid Other | Admitting: Obstetrics and Gynecology

## 2018-06-22 NOTE — Telephone Encounter (Signed)
I called and left vm for patient to call the office at her earliest convenience to reschedule missed appointment and also schedule an anatomy scan. Please advise.

## 2018-06-27 ENCOUNTER — Other Ambulatory Visit: Payer: Self-pay

## 2018-06-27 ENCOUNTER — Ambulatory Visit (INDEPENDENT_AMBULATORY_CARE_PROVIDER_SITE_OTHER): Payer: Medicaid Other | Admitting: Obstetrics and Gynecology

## 2018-06-27 ENCOUNTER — Encounter: Payer: Self-pay | Admitting: Obstetrics and Gynecology

## 2018-06-27 VITALS — BP 98/57 | HR 98 | Wt 237.6 lb

## 2018-06-27 DIAGNOSIS — Z8632 Personal history of gestational diabetes: Secondary | ICD-10-CM | POA: Insufficient documentation

## 2018-06-27 DIAGNOSIS — Z6833 Body mass index (BMI) 33.0-33.9, adult: Secondary | ICD-10-CM

## 2018-06-27 DIAGNOSIS — Z3492 Encounter for supervision of normal pregnancy, unspecified, second trimester: Secondary | ICD-10-CM

## 2018-06-27 DIAGNOSIS — O24419 Gestational diabetes mellitus in pregnancy, unspecified control: Secondary | ICD-10-CM

## 2018-06-27 DIAGNOSIS — E669 Obesity, unspecified: Secondary | ICD-10-CM | POA: Insufficient documentation

## 2018-06-27 MED ORDER — ACCU-CHEK FASTCLIX LANCETS MISC: 1.0000 | Freq: Four times a day (QID) | 12 refills | Status: DC

## 2018-06-27 MED ORDER — GLUCOSE BLOOD VI STRP: ORAL_STRIP | 12 refills | Status: DC

## 2018-06-27 MED ORDER — BLOOD GLUCOSE METER KIT: PACK | 0 refills | Status: DC

## 2018-06-27 NOTE — Progress Notes (Signed)
ROB- pt is doing well 

## 2018-06-27 NOTE — Addendum Note (Signed)
Addended by: Rosine Beat L on: 06/27/2018 02:18 PM   Modules accepted: Orders

## 2018-06-27 NOTE — Progress Notes (Signed)
ROB- denies any concerns, is out of work due to work closed due to [employer]; still needs anatomy scan- will schedule today. Saw lifestyles but they did not give her a meter, instructed to stop by and pick one up, to send BS numbers vis MyChart and will follow up accordingly.

## 2018-06-27 NOTE — Patient Instructions (Signed)
Gestational Diabetes Mellitus, Self Care When you have gestational diabetes (gestational diabetes mellitus), you must make sure your blood sugar (glucose) stays in a healthy range. You can do this with:  Nutrition.  Exercise.  Lifestyle changes.  Medicines or insulin, if needed.  Support from your doctors and others. If you get treated for this condition, it may not hurt you or your unborn baby (fetus). If you do not get treated for this condition, it may cause problems that can hurt you or your unborn baby. If you get gestational diabetes, you are:  More likely to get it if you get pregnant again.  More likely to develop type 2 diabetes in the future. How to stay aware of blood sugar   Check your blood sugar every day while you are pregnant. Check it as often as told.  Call your doctor if your blood sugar is above your goal numbers for two tests in a row. Your doctor will set personal treatment goals for you. Generally, you should have these blood sugar levels:  Before meals, or after not eating for a long time (fasting or preprandial): at or below 95 mg/dL (5.3 mmol/L).  After meals (postprandial): ? One hour after a meal: at or below 140 mg/dL (7.8 mmol/L). ? Two hours after a meal: at or below 120 mg/dL (6.7 mmol/L).  A1c (hemoglobin A1c) level: 6-6.5%. How to manage high and low blood sugar Signs of high blood sugar High blood sugar is called hyperglycemia. Know the early signs of high blood sugar. Signs may include:  Feeling: ? Thirsty. ? Hungry. ? Very tired.  Needing to pee (urinate) more than usual.  Blurry vision. Signs of low blood sugar Low blood sugar is called hypoglycemia. This is when blood sugar is at or below 70 mg/dL (3.9 mmol/L). Signs may include:  Feeling: ? Hungry. ? Worried or nervous (anxious). ? Sweaty and clammy. ? Confused. ? Dizzy. ? Sleepy. ? Sick to your stomach (nauseous).  Having: ? A fast heartbeat. ? A headache. ? A change  in your vision. ? Tingling or no feeling (numbness) around your mouth, lips, or tongue. ? Jerky movements that you cannot control (seizure).  Having trouble with: ? Moving (coordination). ? Sleeping. ? Passing out (fainting). ? Getting upset easily (irritability). Treating low blood sugar To treat low blood sugar, eat or drink something sugary right away. If you can think clearly and swallow safely, follow the 15:15 rule:  Take 15 grams of a fast-acting carb (carbohydrate). Talk with your doctor about how much you should take.  Some fast-acting carbs are: ? Sugar tablets (glucose pills). Take 3-4 glucose pills. ? 6-8 pieces of hard candy. ? 4-6 oz (120-150 mL) of fruit juice. ? 4-6 oz (120-150 mL) of regular (not diet) soda. ? 1 Tbsp (15 mL) honey or sugar.  Check your blood sugar 15 minutes after you take the carb.  If your blood sugar is still at or below 70 mg/dL (3.9 mmol/L), take 15 grams of a carb again.  If your blood sugar does not go above 70 mg/dL (3.9 mmol/L) after 3 tries, get help right away.  After your blood sugar goes back to normal, eat a meal or a snack within 1 hour. Treating very low blood sugar If your blood sugar is at or below 54 mg/dL (3 mmol/L), you have very low blood sugar (severe hypoglycemia). This is an emergency. Do not wait to see if the symptoms will go away. Get medical help right  away. Call your local emergency services (911 in the [place] If you have very low blood sugar and you cannot eat or drink, you may need a glucagon shot (injection). A family member or friend should learn how to check your blood sugar and how to give you a glucagon shot. Ask your doctor if you need to have a glucagon shot kit at home. Follow these instructions at home: Medicine  Take your insulin and diabetes medicines as told.  If your doctor says you should take more or less insulin or medicines, do this exactly as told.  Do not run out of insulin or  medicines. Food   Make healthy food choices. These include: ? Chicken, fish, egg whites, and beans. ? Oats, whole wheat, bulgur, brown rice, quinoa, and millet. ? Fresh fruits and vegetables. ? Low-fat dairy products. ? Nuts, avocado, olive oil, and canola oil.  Meet with a food specialist (dietitian). He or she can help you make an eating plan that is right for you.  Follow instructions from your doctor about what you cannot eat or drink.  Drink enough fluid to keep your pee (urine) pale yellow.  Eat healthy snacks between healthy meals.  Keep track of carbs that you eat. Do this by reading food labels and learning food serving sizes.  Follow your sick day plan when you cannot eat or drink normally. Make this plan with your doctor so it is ready to use. Activity  Exercise for 30 or more minutes a day, or as much as your doctor recommends.  Talk with your doctor before you start a new exercise or activity. Your doctor may need to tell you to change: ? How much insulin or medicines you take. ? How much food you eat. Lifestyle  Do not drink alcohol.  Do not use any tobacco products. These include cigarettes, chewing tobacco, and e-cigarettes. If you need help quitting, ask your doctor.  Learn how to deal with stress. If you need help with this, ask your doctor. Body care  Stay up to date with your shots (immunizations).  Brush your teeth and gums two times a day. Floss one or more times a day.  Go to the dentist one or more times every 6 months.  Stay at a healthy weight while you are pregnant. General instructions  Take over-the-counter and prescription medicines only as told by your doctor.  Ask your doctor about risks of high blood pressure in pregnancy (preeclampsia and eclampsia).  Share your diabetes care plan with: ? Your work or school. ? People you live with.  Check your pee for ketones: ? When you are sick. ? As told by your doctor.  Carry a card or  wear jewelry that says you have diabetes.  Keep all follow-up visits as told by your doctor. This is important. Care after giving birth  Have your blood sugar checked 4-12 weeks after you give birth.  Get checked for diabetes one or more times during 3 years. Questions to ask your doctor  Do I need to meet with a diabetes educator?  Where can I find a support group for people with gestational diabetes? Where to find more information To learn more about diabetes, visit:  American Diabetes Association: www.diabetes.org  Centers for Disease Control and Prevention (CDC): www.cdc.gov Summary  Check your blood sugar (glucose) every day while you are pregnant. Check it as often as told.  Take your insulin and diabetes medicines as told.  Keep all follow-up visits as   told by your doctor. This is important.  Have your blood sugar checked 4-12 weeks after you give birth. This information is not intended to replace advice given to you by your health care provider. Make sure you discuss any questions you have with your health care provider. Document Released: 07/20/2015 Document Revised: 09/18/2017 Document Reviewed: 05/01/2015 Elsevier Interactive Patient Education  2019 Elsevier Inc.     

## 2018-06-28 ENCOUNTER — Telehealth: Payer: Self-pay | Admitting: Obstetrics and Gynecology

## 2018-06-28 NOTE — Telephone Encounter (Signed)
The patient called and stated that she did not receive a meter. The patient stated that she needs that to be called in for her gestational diabetes. Please advise.

## 2018-06-28 NOTE — Telephone Encounter (Signed)
refaxed rx.

## 2018-07-02 ENCOUNTER — Other Ambulatory Visit: Payer: Self-pay | Admitting: Obstetrics and Gynecology

## 2018-07-04 ENCOUNTER — Telehealth: Payer: Self-pay

## 2018-07-04 NOTE — Telephone Encounter (Signed)
The patient called back and was pre-screened for her Anatomy Scan tomorrow, and had no symptoms; however, when she was made aware of the NO VISITOR policy right now, she got upset, cried, and then hung up before I could explain the [facility] Policy to her.  The office manager, CM, was notified and then the husband called back shortly thereafter, and asked again, and KC then explained that there was a NO VISITOR policy via [place] at this time.  I did apologize to the patient twice, and then the line went dead.

## 2018-07-04 NOTE — Telephone Encounter (Signed)
LMTRC needs to be prescreened.  

## 2018-07-05 ENCOUNTER — Other Ambulatory Visit: Payer: Self-pay

## 2018-07-05 ENCOUNTER — Ambulatory Visit (INDEPENDENT_AMBULATORY_CARE_PROVIDER_SITE_OTHER): Payer: Medicaid Other

## 2018-07-05 DIAGNOSIS — Z363 Encounter for antenatal screening for malformations: Secondary | ICD-10-CM

## 2018-07-23 ENCOUNTER — Other Ambulatory Visit: Payer: Self-pay

## 2018-07-23 ENCOUNTER — Ambulatory Visit (INDEPENDENT_AMBULATORY_CARE_PROVIDER_SITE_OTHER): Payer: Medicaid Other | Admitting: Certified Nurse Midwife

## 2018-07-23 VITALS — BP 96/60 | HR 90 | Wt 229.7 lb

## 2018-07-23 DIAGNOSIS — O09899 Supervision of other high risk pregnancies, unspecified trimester: Secondary | ICD-10-CM

## 2018-07-23 DIAGNOSIS — O34219 Maternal care for unspecified type scar from previous cesarean delivery: Secondary | ICD-10-CM

## 2018-07-23 DIAGNOSIS — Z3492 Encounter for supervision of normal pregnancy, unspecified, second trimester: Secondary | ICD-10-CM

## 2018-07-23 DIAGNOSIS — Z2839 Other underimmunization status: Secondary | ICD-10-CM

## 2018-07-23 DIAGNOSIS — Z283 Underimmunization status: Secondary | ICD-10-CM

## 2018-07-23 DIAGNOSIS — Z98891 History of uterine scar from previous surgery: Secondary | ICD-10-CM

## 2018-07-23 DIAGNOSIS — O09529 Supervision of elderly multigravida, unspecified trimester: Secondary | ICD-10-CM | POA: Insufficient documentation

## 2018-07-23 DIAGNOSIS — O09522 Supervision of elderly multigravida, second trimester: Secondary | ICD-10-CM

## 2018-07-23 NOTE — Progress Notes (Signed)
Tasha Rodriguez-Doing well overall. Blood sugars are "fine" per pt, states fasting is a little higher than normal at 105-110. Encouraged protein snack prior bed. Discussed COVID restrictions and precautions. Anticipatory guidance regarding course of prenatal care. Reviewed red flag symptoms and when to call. RTC x 5 weeks for Tasha Rodriguez or sooner if needed.

## 2018-07-23 NOTE — Progress Notes (Signed)
ROB-pt is doing well, BS staying normal

## 2018-07-23 NOTE — Patient Instructions (Signed)
Third Trimester of Pregnancy    The third trimester is from week 28 through week 40 (months 7 through 9). This trimester is when your unborn baby (fetus) is growing very fast. At the end of the ninth month, the unborn baby is about 20 inches in length. It weighs about 6-10 pounds.  Follow these instructions at home:  Medicines   Take over-the-counter and prescription medicines only as told by your doctor. Some medicines are safe and some medicines are not safe during pregnancy.   Take a prenatal vitamin that contains at least 600 micrograms (mcg) of folic acid.   If you have trouble pooping (constipation), take medicine that will make your stool soft (stool softener) if your doctor approves.  Eating and drinking     Eat regular, healthy meals.   Avoid raw meat and uncooked cheese.   If you get low calcium from the food you eat, talk to your doctor about taking a daily calcium supplement.   Eat four or five small meals rather than three large meals a day.   Avoid foods that are high in fat and sugars, such as fried and sweet foods.   To prevent constipation:  ? Eat foods that are high in fiber, like fresh fruits and vegetables, whole grains, and beans.  ? Drink enough fluids to keep your pee (urine) clear or pale yellow.  Activity   Exercise only as told by your doctor. Stop exercising if you start to have cramps.   Avoid heavy lifting, wear low heels, and sit up straight.   Do not exercise if it is too hot, too humid, or if you are in a place of great height (high altitude).   You may continue to have sex unless your doctor tells you not to.  Relieving pain and discomfort   Wear a good support bra if your breasts are tender.   Take frequent breaks and rest with your legs raised if you have leg cramps or low back pain.   Take warm water baths (sitz baths) to soothe pain or discomfort caused by hemorrhoids. Use hemorrhoid cream if your doctor approves.   If you develop puffy, bulging veins (varicose  veins) in your legs:  ? Wear support hose or compression stockings as told by your doctor.  ? Raise (elevate) your feet for 15 minutes, 3-4 times a day.  ? Limit salt in your food.  Safety   Wear your seat belt when driving.   Make a list of emergency phone numbers, including numbers for family, friends, the hospital, and police and fire departments.  Preparing for your baby's arrival  To prepare for the arrival of your baby:   Take prenatal classes.   Practice driving to the hospital.   Visit the hospital and tour the maternity area.   Talk to your work about taking leave once the baby comes.   Pack your hospital bag.   Prepare the baby's room.   Go to your doctor visits.   Buy a rear-facing car seat. Learn how to install it in your car.  General instructions   Do not use hot tubs, steam rooms, or saunas.   Do not use any products that contain nicotine or tobacco, such as cigarettes and e-cigarettes. If you need help quitting, ask your doctor.   Do not drink alcohol.   Do not douche or use tampons or scented sanitary pads.   Do not cross your legs for long periods of time.   Do   not travel for long distances unless you must. Only do so if your doctor says it is okay.   Visit your dentist if you have not gone during your pregnancy. Use a soft toothbrush to brush your teeth. Be gentle when you floss.   Avoid cat litter boxes and soil used by cats. These carry germs that can cause birth defects in the baby and can cause a loss of your baby (miscarriage) or stillbirth.   Keep all your prenatal visits as told by your doctor. This is important.  Contact a doctor if:   You are not sure if you are in labor or if your water has broken.   You are dizzy.   You have mild cramps or pressure in your lower belly.   You have a nagging pain in your belly area.   You continue to feel sick to your stomach, you throw up, or you have watery poop.   You have bad smelling fluid coming from your vagina.   You have  pain when you pee.  Get help right away if:   You have a fever.   You are leaking fluid from your vagina.   You are spotting or bleeding from your vagina.   You have severe belly cramps or pain.   You lose or gain weight quickly.   You have trouble catching your breath and have chest pain.   You notice sudden or extreme puffiness (swelling) of your face, hands, ankles, feet, or legs.   You have not felt the baby move in over an hour.   You have severe headaches that do not go away with medicine.   You have trouble seeing.   You are leaking, or you are having a gush of fluid, from your vagina before you are 37 weeks.   You have regular belly spasms (contractions) before you are 37 weeks.  Summary   The third trimester is from week 28 through week 40 (months 7 through 9). This time is when your unborn baby is growing very fast.   Follow your doctor's advice about medicine, food, and activity.   Get ready for the arrival of your baby by taking prenatal classes, getting all the baby items ready, preparing the baby's room, and visiting your doctor to be checked.   Get help right away if you are bleeding from your vagina, or you have chest pain and trouble catching your breath, or if you have not felt your baby move in over an hour.  This information is not intended to replace advice given to you by your health care provider. Make sure you discuss any questions you have with your health care provider.  Document Released: 06/22/2009 Document Revised: 05/03/2016 Document Reviewed: 05/03/2016  Elsevier Interactive Patient Education  2019 [employer]  Fetal Movement Counts  Patient Name: ________________________________________________ Patient Due Date: ____________________  What is a fetal movement count?    A fetal movement count is the number of times that you feel your baby move during a certain amount of time. This may also be called a fetal kick count. A fetal movement count is recommended for every  pregnant woman. You may be asked to start counting fetal movements as early as week 28 of your pregnancy.  Pay attention to when your baby is most active. You may notice your baby's sleep and wake cycles. You may also notice things that make your baby move more. You should do a fetal movement count:   When your   baby is normally most active.   At the same time each day.  A good time to count movements is while you are resting, after having something to eat and drink.  How do I count fetal movements?  1. Find a quiet, comfortable area. Sit, or lie down on your side.  2. Write down the date, the start time and stop time, and the number of movements that you felt between those two times. Take this information with you to your health care visits.  3. For 2 hours, count kicks, flutters, swishes, rolls, and jabs. You should feel at least 10 movements during 2 hours.  4. You may stop counting after you have felt 10 movements.  5. If you do not feel 10 movements in 2 hours, have something to eat and drink. Then, keep resting and counting for 1 hour. If you feel at least 4 movements during that hour, you may stop counting.  Contact a health care provider if:   You feel fewer than 4 movements in 2 hours.   Your baby is not moving like he or she usually does.  Date: ____________ Start time: ____________ Stop time: ____________ Movements: ____________  Date: ____________ Start time: ____________ Stop time: ____________ Movements: ____________  Date: ____________ Start time: ____________ Stop time: ____________ Movements: ____________  Date: ____________ Start time: ____________ Stop time: ____________ Movements: ____________  Date: ____________ Start time: ____________ Stop time: ____________ Movements: ____________  Date: ____________ Start time: ____________ Stop time: ____________ Movements: ____________  Date: ____________ Start time: ____________ Stop time: ____________ Movements: ____________  Date: ____________ Start  time: ____________ Stop time: ____________ Movements: ____________  Date: ____________ Start time: ____________ Stop time: ____________ Movements: ____________  This information is not intended to replace advice given to you by your health care provider. Make sure you discuss any questions you have with your health care provider.  Document Released: 04/27/2006 Document Revised: 11/25/2015 Document Reviewed: 05/07/2015  Elsevier Interactive Patient Education  2019 Elsevier Inc[employer]

## 2018-07-24 ENCOUNTER — Encounter: Payer: Medicaid Other | Admitting: Certified Nurse Midwife

## 2018-08-27 ENCOUNTER — Telehealth: Payer: Self-pay

## 2018-08-27 NOTE — Telephone Encounter (Signed)
Coronavirus (COVID-19) Are you at risk?  Are you at risk for the Coronavirus (COVID-19)?  To be considered HIGH RISK for Coronavirus (COVID-19), you have to meet the following criteria:  . Traveled to [place], [place], [place], [place] or [place]; or in the [place] to [place], [place], [place], or [place]; and have fever, cough, and shortness of breath within the last 2 weeks of travel OR . Been in close contact with a person diagnosed with COVID-19 within the last 2 weeks and have fever, cough, and shortness of breath . IF YOU DO NOT MEET THESE CRITERIA, YOU ARE CONSIDERED LOW RISK FOR COVID-19.  What to do if you are HIGH RISK for COVID-19?  . If you are having a medical emergency, call 911. . Seek medical care right away. Before you go to a doctor's office, urgent care or emergency department, call ahead and tell them about your recent travel, contact with someone diagnosed with COVID-19, and your symptoms. You should receive instructions from your physician's office regarding next steps of care.  . When you arrive at healthcare provider, tell the healthcare staff immediately you have returned from visiting [place], [place], [place], [place] or [place]; or traveled in the [place] to [place], [place], [place], or [place]; in the last two weeks or you have been in close contact with a person diagnosed with COVID-19 in the last 2 weeks.   . Tell the health care staff about your symptoms: fever, cough and shortness of breath. . After you have been seen by a medical provider, you will be either: o Tested for (COVID-19) and discharged home on quarantine except to seek medical care if symptoms worsen, and asked to  - Stay home and avoid contact with others until you get your results (4-5 days)  - Avoid travel on public transportation if possible (such as bus, train, or airplane) or o Sent to the Emergency Department by EMS for evaluation, COVID-19 testing, and possible  admission depending on your condition and test results.  What to do if you are LOW RISK for COVID-19?  Reduce your risk of any infection by using the same precautions used for avoiding the common cold or flu:  . Wash your hands often with soap and warm water for at least 20 seconds.  If soap and water are not readily available, use an alcohol-based hand sanitizer with at least 60% alcohol.  . If coughing or sneezing, cover your mouth and nose by coughing or sneezing into the elbow areas of your shirt or coat, into a tissue or into your sleeve (not your hands). . Avoid shaking hands with others and consider head nods or verbal greetings only. . Avoid touching your eyes, nose, or mouth with unwashed hands.  . Avoid close contact with people who are [employer]lee Fate. . Avoid places or events with large numbers of people in one location, like concerts or sporting events. . Carefully consider travel plans you have or are making. . If you are planning any travel outside or inside the [place], visit the CDC's Travelers' Health webpage for the latest health notices. . If you have some symptoms but not all symptoms, continue to monitor at home and seek medical attention if your symptoms worsen. . If you are having a medical emergency, call 911.  08/27/18 SCREENING NEG SLS ADDITIONAL HEALTHCARE OPTIONS FOR PATIENTS  [facility] / e-Visit: [email or web address]         [facility]: [phone]    [facility]: [phone]                   [facility]: [phone]  

## 2018-08-28 ENCOUNTER — Ambulatory Visit (INDEPENDENT_AMBULATORY_CARE_PROVIDER_SITE_OTHER): Payer: Medicaid Other | Admitting: Obstetrics and Gynecology

## 2018-08-28 ENCOUNTER — Encounter: Payer: Medicaid Other | Admitting: Certified Nurse Midwife

## 2018-08-28 ENCOUNTER — Other Ambulatory Visit: Payer: Self-pay

## 2018-08-28 VITALS — BP 89/58 | HR 91 | Wt 237.7 lb

## 2018-08-28 DIAGNOSIS — Z3493 Encounter for supervision of normal pregnancy, unspecified, third trimester: Secondary | ICD-10-CM

## 2018-08-28 LAB — POCT URINALYSIS DIPSTICK OB
Bilirubin, UA: NEGATIVE
Blood, UA: NEGATIVE
Glucose, UA: NEGATIVE
Ketones, UA: NEGATIVE
Leukocytes, UA: NEGATIVE
Nitrite, UA: NEGATIVE
POC,PROTEIN,UA: NEGATIVE
Spec Grav, UA: 1.015 (ref 1.010–1.025)
Urobilinogen, UA: 0.2 E.U./dL
pH, UA: 6 (ref 5.0–8.0)

## 2018-08-28 MED ORDER — GLYBURIDE 2.5 MG PO TABS: 2.5000 mg | ORAL_TABLET | Freq: Every day | ORAL | 2 refills | Status: DC

## 2018-08-28 NOTE — Progress Notes (Signed)
ROB- reviewed glucose log, all PP normal, but FBS all > 100, recommend start of glyburide 2.5mg 

## 2018-08-28 NOTE — Progress Notes (Signed)
ROB- pt is doing well, BS log copied

## 2018-09-10 ENCOUNTER — Telehealth: Payer: Self-pay

## 2018-09-10 NOTE — Progress Notes (Signed)
ROB- Pt [redacted]w[redacted]d present today to discuss c-section. Pt stated that she is having pressure in the lower abd area. No other concerns.

## 2018-09-10 NOTE — Telephone Encounter (Signed)
Pt called no answer LM via voicemail to call the office to do prescreening.  

## 2018-09-11 ENCOUNTER — Encounter: Payer: Self-pay | Admitting: Obstetrics and Gynecology

## 2018-09-11 ENCOUNTER — Ambulatory Visit (INDEPENDENT_AMBULATORY_CARE_PROVIDER_SITE_OTHER): Payer: Medicaid Other | Admitting: Obstetrics and Gynecology

## 2018-09-11 ENCOUNTER — Other Ambulatory Visit: Payer: Self-pay

## 2018-09-11 VITALS — BP 104/59 | HR 76 | Wt 245.5 lb

## 2018-09-11 DIAGNOSIS — Z3A33 33 weeks gestation of pregnancy: Secondary | ICD-10-CM | POA: Diagnosis not present

## 2018-09-11 DIAGNOSIS — O24415 Gestational diabetes mellitus in pregnancy, controlled by oral hypoglycemic drugs: Secondary | ICD-10-CM | POA: Diagnosis not present

## 2018-09-11 DIAGNOSIS — O34219 Maternal care for unspecified type scar from previous cesarean delivery: Secondary | ICD-10-CM

## 2018-09-11 DIAGNOSIS — O09523 Supervision of elderly multigravida, third trimester: Secondary | ICD-10-CM

## 2018-09-11 DIAGNOSIS — Z98891 History of uterine scar from previous surgery: Secondary | ICD-10-CM

## 2018-09-11 DIAGNOSIS — O99210 Obesity complicating pregnancy, unspecified trimester: Secondary | ICD-10-CM

## 2018-09-11 DIAGNOSIS — O99213 Obesity complicating pregnancy, third trimester: Secondary | ICD-10-CM

## 2018-09-11 LAB — POCT URINALYSIS DIPSTICK OB
Bilirubin, UA: NEGATIVE
Blood, UA: NEGATIVE
Glucose, UA: NEGATIVE
Ketones, UA: NEGATIVE
Leukocytes, UA: NEGATIVE
Nitrite, UA: NEGATIVE
POC,PROTEIN,UA: NEGATIVE
Spec Grav, UA: 1.025 (ref 1.010–1.025)
Urobilinogen, UA: 0.2 E.U./dL
pH, UA: 6 (ref 5.0–8.0)

## 2018-09-11 NOTE — Progress Notes (Signed)
ROB: Referred from midwifery service for consultation for repeat C-section. Patient with h/o prior C-section x 2, declines vaginal attempt. Patient doing well, does feel some pressure in pelvis.  Discussed risks benefits of repeat C-section. Desires to have surgery scheduled for 10/22/2018. 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 Notes fasting blood sugars have improved (from 110s down to 80s), sometimes having hypoglycemia at night and requires a snack. Discussed need to begin antenatal testing. Will begin with once weekly. RTC in 2 weeks. Will need growth scan at 36 weeks.     NONSTRESS TEST INTERPRETATION  INDICATIONS: GDM (on glyburide) and  AMA  FHR baseline: 145 bpm RESULTS:Reactive COMMENTS: No contractions   PLAN: 1. Continue fetal kick counts twice a day. 2. Continue antepartum testing as scheduled-weekly

## 2018-09-11 NOTE — Patient Instructions (Signed)
Nonstress Test °A nonstress test is a procedure that is done during pregnancy in order to check the baby's heartbeat. The procedure can help show if the baby (fetus) is healthy. It is commonly done when: °· The baby is past his or her due date. °· The pregnancy is high risk. °· The baby is moving less than normal. °· The mother has lost a pregnancy in the past. °· The health care provider suspects a problem with the baby's growth. °· There is too much or too little amniotic fluid. °The procedure is often done in the third trimester of pregnancy to find out if an early delivery is needed and whether such a delivery is safe. °During a nonstress test, the baby's heartbeat is monitored when the baby is resting and when the baby is moving. If the baby is healthy, the heart rate will increase when he or she moves or kicks and will return to normal when he or she rests. °Tell a health care provider about: °· Any allergies you have. °· Any medical conditions you have. °· All medicines you are taking, including vitamins, herbs, eye drops, creams, and over-the-counter medicines. °What are the risks? °There are no risks to you or your baby from a nonstress test. This procedure should not be painful or uncomfortable. °What happens before the procedure? °· Eat a meal right before the test or as directed by your health care provider. Food may help encourage the baby to move. °· Use the restroom right before the test. °What happens during the procedure? °· Two monitors will be placed on your abdomen. One will record the baby's heart rate and the other will record the contractions of your uterus. °· You may be asked to lie down on your side or to sit upright. °· You may be given a button to press when you feel your baby move. °· Your health care provider will listen to your baby's heartbeat and recorded it. He or she may also watch your baby's heartbeat on a screen. °· If the baby seems to be sleeping, you may be asked to drink  some juice or soda, eat a snack, or change positions. °The procedure may vary among health care providers and hospitals. °What happens after the procedure? °· Your health care provider will discuss the test results with you and make recommendations for the future. Depending on the results, your health care provider may order additional tests or another course of action. °· If your health care provider gave you any diet or activity instructions, make sure to follow them. °· Keep all follow-up visits as told by your health care provider. This is important. °Summary °· A nonstress test is a procedure that is done during pregnancy in order to check the baby's heartbeat. The procedure can help show if the baby is healthy. °· The procedure is often done in the third trimester of pregnancy to find out if an early delivery is needed and whether such a delivery is safe. °· During a nonstress test, the baby's heartbeat is monitored when the baby is resting and when the baby is moving. If the baby is healthy, the heart rate will increase when he or she moves or kicks and will return to normal when he or she rests. °· Your health care provider will discuss the test results with you and make recommendations for the future. °This information is not intended to replace advice given to you by your health care provider. Make sure you discuss any   questions you have with your health care provider. °Document Released: 03/18/2002 Document Revised: 07/07/2016 Document Reviewed: 07/07/2016 °Elsevier Interactive Patient Education © 2019 Elsevier Inc. ° °

## 2018-09-25 ENCOUNTER — Ambulatory Visit (INDEPENDENT_AMBULATORY_CARE_PROVIDER_SITE_OTHER): Payer: Medicaid Other | Admitting: Certified Nurse Midwife

## 2018-09-25 ENCOUNTER — Encounter: Payer: Self-pay | Admitting: Certified Nurse Midwife

## 2018-09-25 ENCOUNTER — Other Ambulatory Visit: Payer: Self-pay

## 2018-09-25 ENCOUNTER — Other Ambulatory Visit: Payer: Medicaid Other

## 2018-09-25 VITALS — BP 112/66 | HR 90 | Wt 252.2 lb

## 2018-09-25 DIAGNOSIS — Z3493 Encounter for supervision of normal pregnancy, unspecified, third trimester: Secondary | ICD-10-CM

## 2018-09-25 DIAGNOSIS — O09523 Supervision of elderly multigravida, third trimester: Secondary | ICD-10-CM

## 2018-09-25 DIAGNOSIS — O09529 Supervision of elderly multigravida, unspecified trimester: Secondary | ICD-10-CM

## 2018-09-25 DIAGNOSIS — O26893 Other specified pregnancy related conditions, third trimester: Secondary | ICD-10-CM

## 2018-09-25 DIAGNOSIS — O24415 Gestational diabetes mellitus in pregnancy, controlled by oral hypoglycemic drugs: Secondary | ICD-10-CM | POA: Diagnosis not present

## 2018-09-25 DIAGNOSIS — G5600 Carpal tunnel syndrome, unspecified upper limb: Secondary | ICD-10-CM

## 2018-09-25 DIAGNOSIS — O99210 Obesity complicating pregnancy, unspecified trimester: Secondary | ICD-10-CM

## 2018-09-25 DIAGNOSIS — O99213 Obesity complicating pregnancy, third trimester: Secondary | ICD-10-CM

## 2018-09-25 DIAGNOSIS — O26899 Other specified pregnancy related conditions, unspecified trimester: Secondary | ICD-10-CM

## 2018-09-25 LAB — POCT URINALYSIS DIPSTICK OB
Bilirubin, UA: NEGATIVE
Blood, UA: NEGATIVE
Glucose, UA: NEGATIVE
Ketones, UA: NEGATIVE
Leukocytes, UA: NEGATIVE
Nitrite, UA: NEGATIVE
POC,PROTEIN,UA: NEGATIVE
Spec Grav, UA: 1.02 (ref 1.010–1.025)
Urobilinogen, UA: 0.2 E.U./dL
pH, UA: 6 (ref 5.0–8.0)

## 2018-09-25 NOTE — Patient Instructions (Signed)
Fetal Movement Counts Patient Name: ________________________________________________ Patient Due Date: ____________________ What is a fetal movement count?  A fetal movement count is the number of times that you feel your baby move during a certain amount of time. This may also be called a fetal kick count. A fetal movement count is recommended for every pregnant woman. You may be asked to start counting fetal movements as early as week 28 of your pregnancy. Pay attention to when your baby is most active. You may notice your baby's sleep and wake cycles. You may also notice things that make your baby move more. You should do a fetal movement count:  When your baby is normally most active.  At the same time each day. A good time to count movements is while you are resting, after having something to eat and drink. How do I count fetal movements? 1. Find a quiet, comfortable area. Sit, or lie down on your side. 2. Write down the date, the start time and stop time, and the number of movements that you felt between those two times. Take this information with you to your health care visits. 3. For 2 hours, count kicks, flutters, swishes, rolls, and jabs. You should feel at least 10 movements during 2 hours. 4. You may stop counting after you have felt 10 movements. 5. If you do not feel 10 movements in 2 hours, have something to eat and drink. Then, keep resting and counting for 1 hour. If you feel at least 4 movements during that hour, you may stop counting. Contact a health care provider if:  You feel fewer than 4 movements in 2 hours.  Your baby is not moving like he or she usually does. Date: ____________ Start time: ____________ Stop time: ____________ Movements: ____________ Date: ____________ Start time: ____________ Stop time: ____________ Movements: ____________ Date: ____________ Start time: ____________ Stop time: ____________ Movements: ____________ Date: ____________ Start time:  ____________ Stop time: ____________ Movements: ____________ Date: ____________ Start time: ____________ Stop time: ____________ Movements: ____________ Date: ____________ Start time: ____________ Stop time: ____________ Movements: ____________ Date: ____________ Start time: ____________ Stop time: ____________ Movements: ____________ Date: ____________ Start time: ____________ Stop time: ____________ Movements: ____________ Date: ____________ Start time: ____________ Stop time: ____________ Movements: ____________ This information is not intended to replace advice given to you by your health care provider. Make sure you discuss any questions you have with your health care provider. Document Released: 04/27/2006 Document Revised: 11/25/2015 Document Reviewed: 05/07/2015 Elsevier Interactive Patient Education  2019 [employer]. Nonstress Test A nonstress test is a procedure that is done during pregnancy in order to check the baby's heartbeat. The procedure can help show if the baby (fetus) is healthy. It is commonly done when:  The baby is past his or her due date.  The pregnancy is high risk.  The baby is moving less than normal.  The mother has lost a pregnancy in the past.  The health care provider suspects a problem with the baby's growth.  There is too much or too little amniotic fluid. The procedure is often done in the third trimester of pregnancy to find out if an early delivery is needed and whether such a delivery is safe. During a nonstress test, the baby's heartbeat is monitored when the baby is resting and when the baby is moving. If the baby is healthy, the heart rate will increase when he or she moves or kicks and will return to normal when he or she rests. Tell a health  care provider about:  Any allergies you have.  Any medical conditions you have.  All medicines you are taking, including vitamins, herbs, eye drops, creams, and over-the-counter medicines. What are the  risks? There are no risks to you or your baby from a nonstress test. This procedure should not be painful or uncomfortable. What happens before the procedure?  Eat a meal right before the test or as directed by your health care provider. Food may help encourage the baby to move.  Use the restroom right before the test. What happens during the procedure?  Two monitors will be placed on your abdomen. One will record the baby's heart rate and the other will record the contractions of your uterus.  You may be asked to lie down on your side or to sit upright.  You may be given a button to press when you feel your baby move.  Your health care provider will listen to your baby's heartbeat and recorded it. He or she may also watch your baby's heartbeat on a screen.  If the baby seems to be sleeping, you may be asked to drink some juice or soda, eat a snack, or change positions. The procedure may vary among health care providers and hospitals. What happens after the procedure?  Your health care provider will discuss the test results with you and make recommendations for the future. Depending on the results, your health care provider may order additional tests or another course of action.  If your health care provider gave you any diet or activity instructions, make sure to follow them.  Keep all follow-up visits as told by your health care provider. This is important. Summary  A nonstress test is a procedure that is done during pregnancy in order to check the baby's heartbeat. The procedure can help show if the baby is healthy.  The procedure is often done in the third trimester of pregnancy to find out if an early delivery is needed and whether such a delivery is safe.  During a nonstress test, the baby's heartbeat is monitored when the baby is resting and when the baby is moving. If the baby is healthy, the heart rate will increase when he or she moves or kicks and will return to normal when  he or she rests.  Your health care provider will discuss the test results with you and make recommendations for the future. This information is not intended to replace advice given to you by your health care provider. Make sure you discuss any questions you have with your health care provider. Document Released: 03/18/2002 Document Revised: 07/07/2016 Document Reviewed: 07/07/2016 Elsevier Interactive Patient Education  2019 [employer].

## 2018-09-25 NOTE — Progress Notes (Signed)
ROB and NST-Patient c/o bilateral hand numbness and pain x2 weeks. Also, c/o left ear pain x1 week. Discussed home treatment measures. Fasting < 84 and PP < 125. Anticipatory guidance regarding course of prenatal care. Repeat c-section scheduled for 10/22/18 awaiting time. Reviewed red flag symptoms and when to call. RTC x 1 weeks for growth ultrasound and BPP or sooner if needed.   NONSTRESS TEST INTERPRETATION  INDICATIONS: GDM on glyburide  FHR baseline: 130-140 bpm RESULTS:Reactive COMMENTS: Occasional contractions   PLAN: 1. Continue fetal kick counts twice a day. 2. Continue antepartum testing as scheduled-weekly   Diona Fanti, CNM [facility], [facility] 09/25/18 4:46 PM

## 2018-10-04 ENCOUNTER — Ambulatory Visit (INDEPENDENT_AMBULATORY_CARE_PROVIDER_SITE_OTHER): Payer: Medicaid Other | Admitting: Certified Nurse Midwife

## 2018-10-04 ENCOUNTER — Ambulatory Visit (INDEPENDENT_AMBULATORY_CARE_PROVIDER_SITE_OTHER): Payer: Medicaid Other

## 2018-10-04 ENCOUNTER — Other Ambulatory Visit: Payer: Self-pay

## 2018-10-04 VITALS — BP 117/82 | HR 77 | Wt 251.2 lb

## 2018-10-04 DIAGNOSIS — O99210 Obesity complicating pregnancy, unspecified trimester: Secondary | ICD-10-CM

## 2018-10-04 DIAGNOSIS — O24415 Gestational diabetes mellitus in pregnancy, controlled by oral hypoglycemic drugs: Secondary | ICD-10-CM

## 2018-10-04 DIAGNOSIS — Z3493 Encounter for supervision of normal pregnancy, unspecified, third trimester: Secondary | ICD-10-CM | POA: Diagnosis not present

## 2018-10-04 DIAGNOSIS — O09529 Supervision of elderly multigravida, unspecified trimester: Secondary | ICD-10-CM

## 2018-10-04 DIAGNOSIS — Z113 Encounter for screening for infections with a predominantly sexual mode of transmission: Secondary | ICD-10-CM

## 2018-10-04 DIAGNOSIS — Z3A36 36 weeks gestation of pregnancy: Secondary | ICD-10-CM

## 2018-10-04 DIAGNOSIS — Z3685 Encounter for antenatal screening for Streptococcus B: Secondary | ICD-10-CM

## 2018-10-04 LAB — POCT URINALYSIS DIPSTICK OB
Bilirubin, UA: NEGATIVE
Blood, UA: NEGATIVE
Glucose, UA: NEGATIVE
Ketones, UA: NEGATIVE
Leukocytes, UA: NEGATIVE
Nitrite, UA: NEGATIVE
Spec Grav, UA: 1.02 (ref 1.010–1.025)
Urobilinogen, UA: 0.2 E.U./dL
pH, UA: 6 (ref 5.0–8.0)

## 2018-10-04 NOTE — Patient Instructions (Signed)
Nonstress Test  A nonstress test is a procedure that is done during pregnancy in order to check the baby's heartbeat. The procedure can help show if the baby (fetus) is healthy. It is commonly done when:   The baby is past his or her due date.   The pregnancy is high risk.   The baby is moving less than normal.   The mother has lost a pregnancy in the past.   The health care provider suspects a problem with the baby's growth.   There is too much or too little amniotic fluid.  The procedure is often done in the third trimester of pregnancy to find out if an early delivery is needed and whether such a delivery is safe.  During a nonstress test, the baby's heartbeat is monitored when the baby is resting and when the baby is moving. If the baby is healthy, the heart rate will increase when he or she moves or kicks and will return to normal when he or she rests.  Tell a health care provider about:   Any allergies you have.   Any medical conditions you have.   All medicines you are taking, including vitamins, herbs, eye drops, creams, and over-the-counter medicines.  What are the risks?  There are no risks to you or your baby from a nonstress test. This procedure should not be painful or uncomfortable.  What happens before the procedure?   Eat a meal right before the test or as directed by your health care provider. Food may help encourage the baby to move.   Use the restroom right before the test.  What happens during the procedure?   Two monitors will be placed on your abdomen. One will record the baby's heart rate and the other will record the contractions of your uterus.   You may be asked to lie down on your side or to sit upright.   You may be given a button to press when you feel your baby move.   Your health care provider will listen to your baby's heartbeat and recorded it. He or she may also watch your baby's heartbeat on a screen.   If the baby seems to be sleeping, you may be asked to drink  some juice or soda, eat a snack, or change positions.  The procedure may vary among health care providers and hospitals.  What happens after the procedure?   Your health care provider will discuss the test results with you and make recommendations for the future. Depending on the results, your health care provider may order additional tests or another course of action.   If your health care provider gave you any diet or activity instructions, make sure to follow them.   Keep all follow-up visits as told by your health care provider. This is important.  Summary   A nonstress test is a procedure that is done during pregnancy in order to check the baby's heartbeat. The procedure can help show if the baby is healthy.   The procedure is often done in the third trimester of pregnancy to find out if an early delivery is needed and whether such a delivery is safe.   During a nonstress test, the baby's heartbeat is monitored when the baby is resting and when the baby is moving. If the baby is healthy, the heart rate will increase when he or she moves or kicks and will return to normal when he or she rests.   Your health care provider   will discuss the test results with you and make recommendations for the future.  This information is not intended to replace advice given to you by your health care provider. Make sure you discuss any questions you have with your health care provider.  Document Released: 03/18/2002 Document Revised: 07/07/2016 Document Reviewed: 07/07/2016  Elsevier Interactive Patient Education  2019 [employer]    Fetal Movement Counts  Patient Name: ________________________________________________ Patient Due Date: ____________________  What is a fetal movement count?    A fetal movement count is the number of times that you feel your baby move during a certain amount of time. This may also be called a fetal kick count. A fetal movement count is recommended for every pregnant woman. You may be asked  to start counting fetal movements as early as week 28 of your pregnancy.  Pay attention to when your baby is most active. You may notice your baby's sleep and wake cycles. You may also notice things that make your baby move more. You should do a fetal movement count:   When your baby is normally most active.   At the same time each day.  A good time to count movements is while you are resting, after having something to eat and drink.  How do I count fetal movements?  1. Find a quiet, comfortable area. Sit, or lie down on your side.  2. Write down the date, the start time and stop time, and the number of movements that you felt between those two times. Take this information with you to your health care visits.  3. For 2 hours, count kicks, flutters, swishes, rolls, and jabs. You should feel at least 10 movements during 2 hours.  4. You may stop counting after you have felt 10 movements.  5. If you do not feel 10 movements in 2 hours, have something to eat and drink. Then, keep resting and counting for 1 hour. If you feel at least 4 movements during that hour, you may stop counting.  Contact a health care provider if:   You feel fewer than 4 movements in 2 hours.   Your baby is not moving like he or she usually does.  Date: ____________ Start time: ____________ Stop time: ____________ Movements: ____________  Date: ____________ Start time: ____________ Stop time: ____________ Movements: ____________  Date: ____________ Start time: ____________ Stop time: ____________ Movements: ____________  Date: ____________ Start time: ____________ Stop time: ____________ Movements: ____________  Date: ____________ Start time: ____________ Stop time: ____________ Movements: ____________  Date: ____________ Start time: ____________ Stop time: ____________ Movements: ____________  Date: ____________ Start time: ____________ Stop time: ____________ Movements: ____________  Date: ____________ Start time: ____________ Stop time:  ____________ Movements: ____________  Date: ____________ Start time: ____________ Stop time: ____________ Movements: ____________  This information is not intended to replace advice given to you by your health care provider. Make sure you discuss any questions you have with your health care provider.  Document Released: 04/27/2006 Document Revised: 11/25/2015 Document Reviewed: 05/07/2015  Elsevier Interactive Patient Education  2019 [employer] Inc[employer]

## 2018-10-04 NOTE — Progress Notes (Signed)
ROB-No complaints.  

## 2018-10-04 NOTE — Progress Notes (Signed)
ROB-Doing well. Fastings<80, PP<100. BPP: 8/8. Growth 36%. Anticipatory guidance regarding course of prenatal care. Reviewed red flag symptoms and when to call. RTC x 1 wk for NST and ROB or sooner if needed.   ULTRASOUND REPORT  Location: [facility] Date of Service: 10/04/2018   Indications:growth/afi Findings:  Tasha Rodriguez intrauterine pregnancy is visualized with FHR at 139 BPM. Biometrics give an (U/S) Gestational age of [redacted]w[redacted]d and an (U/S) EDD of 11/22/2018; this does not correlates with the clinically established Estimated Date of Delivery: 10/30/18.  Fetal presentation is Cephalic.  Placenta: posterior. Grade: 2 AFI: 10.5 cm  Growth percentile is 36. EFW: 2038 g (4 lbs 8oz )   BPP Scoring: Movement: 2/2  Tone: 2/2  Breathing: 2/2  AFI: 2/2   Impression: 1. [redacted]w[redacted]d Viable Singleton Intrauterine pregnancy previously established criteria. 2. Growth is 36 %ile.    2. AFI is 10.5 cm.  3. BPP is 8/8 4. Fetus is measuring 3 weeks behind symmetrically. Recommendations: 1.Clinical correlation with the patient's History and Physical Exam.

## 2018-10-06 ENCOUNTER — Encounter: Payer: Self-pay | Admitting: Certified Nurse Midwife

## 2018-10-06 LAB — STREP GP B NAA: Strep Gp B NAA: NEGATIVE

## 2018-10-07 LAB — GC/CHLAMYDIA PROBE AMP
Chlamydia trachomatis, NAA: NEGATIVE
Neisseria Gonorrhoeae by PCR: NEGATIVE

## 2018-10-08 ENCOUNTER — Other Ambulatory Visit: Payer: Self-pay

## 2018-10-08 MED ORDER — CEFDINIR 300 MG PO CAPS: 300.0000 mg | ORAL_CAPSULE | Freq: Two times a day (BID) | ORAL | 0 refills | Status: DC

## 2018-10-08 NOTE — Telephone Encounter (Signed)
Omnicef 300 mg PO BID x 7 days, dispense: 14, no refills. Thanks, JML

## 2018-10-09 ENCOUNTER — Telehealth: Payer: Self-pay

## 2018-10-09 NOTE — Telephone Encounter (Signed)
Coronavirus (COVID-19) Are you at risk?  Are you at risk for the Coronavirus (COVID-19)?  To be considered HIGH RISK for Coronavirus (COVID-19), you have to meet the following criteria:  . Traveled to [place], [place], [place], [place] or [place]; or in the [place] to [place], [place], [place], or [place]; and have fever, cough, and shortness of breath within the last 2 weeks of travel OR . Been in close contact with a person diagnosed with COVID-19 within the last 2 weeks and have fever, cough, and shortness of breath . IF YOU DO NOT MEET THESE CRITERIA, YOU ARE CONSIDERED LOW RISK FOR COVID-19.  What to do if you are HIGH RISK for COVID-19?  . If you are having a medical emergency, call 911. . Seek medical care right away. Before you go to a doctor's office, urgent care or emergency department, call ahead and tell them about your recent travel, contact with someone diagnosed with COVID-19, and your symptoms. You should receive instructions from your physician's office regarding next steps of care.  . When you arrive at healthcare provider, tell the healthcare staff immediately you have returned from visiting [place], [place], [place], [place] or [place]; or traveled in the [place] to [place], [place], [place], or [place]; in the last two weeks or you have been in close contact with a person diagnosed with COVID-19 in the last 2 weeks.   . Tell the health care staff about your symptoms: fever, cough and shortness of breath. . After you have been seen by a medical provider, you will be either: o Tested for (COVID-19) and discharged home on quarantine except to seek medical care if symptoms worsen, and asked to  - Stay home and avoid contact with others until you get your results (4-5 days)  - Avoid travel on public transportation if possible (such as bus, train, or airplane) or o Sent to the Emergency Department by EMS for evaluation, COVID-19 testing, and possible  admission depending on your condition and test results.  What to do if you are LOW RISK for COVID-19?  Reduce your risk of any infection by using the same precautions used for avoiding the common cold or flu:  . Wash your hands often with soap and warm water for at least 20 seconds.  If soap and water are not readily available, use an alcohol-based hand sanitizer with at least 60% alcohol.  . If coughing or sneezing, cover your mouth and nose by coughing or sneezing into the elbow areas of your shirt or coat, into a tissue or into your sleeve (not your hands). . Avoid shaking hands with others and consider head nods or verbal greetings only. . Avoid touching your eyes, nose, or mouth with unwashed hands.  . Avoid close contact with people who are Tasha Rodriguez. . Avoid places or events with large numbers of people in one location, like concerts or sporting events. . Carefully consider travel plans you have or are making. . If you are planning any travel outside or inside the [place], visit the CDC's Travelers' Health webpage for the latest health notices. . If you have some symptoms but not all symptoms, continue to monitor at home and seek medical attention if your symptoms worsen. . If you are having a medical emergency, call 911.  10/09/18 SCREENING NEG SLS ADDITIONAL HEALTHCARE OPTIONS FOR PATIENTS  [name]: [name]         [facility]: [phone]    [facility]: [phone]                   [facility]: [phone]  

## 2018-10-10 ENCOUNTER — Other Ambulatory Visit: Payer: Medicaid Other

## 2018-10-10 ENCOUNTER — Other Ambulatory Visit: Payer: Self-pay

## 2018-10-10 ENCOUNTER — Encounter: Payer: Self-pay | Admitting: Certified Nurse Midwife

## 2018-10-10 ENCOUNTER — Ambulatory Visit (INDEPENDENT_AMBULATORY_CARE_PROVIDER_SITE_OTHER): Payer: Medicaid Other | Admitting: Certified Nurse Midwife

## 2018-10-10 ENCOUNTER — Observation Stay
Admission: EM | Admit: 2018-10-10 | Discharge: 2018-10-10 | Disposition: A | Discharge status: Home / Self Care | Payer: Medicaid Other | Attending: Certified Nurse Midwife | Admitting: Certified Nurse Midwife

## 2018-10-10 ENCOUNTER — Observation Stay: Payer: Medicaid Other

## 2018-10-10 ENCOUNTER — Encounter: Payer: Medicaid Other | Admitting: Certified Nurse Midwife

## 2018-10-10 VITALS — BP 113/83 | HR 88 | Wt 249.4 lb

## 2018-10-10 DIAGNOSIS — O24415 Gestational diabetes mellitus in pregnancy, controlled by oral hypoglycemic drugs: Secondary | ICD-10-CM | POA: Diagnosis not present

## 2018-10-10 DIAGNOSIS — O288 Other abnormal findings on antenatal screening of mother: Secondary | ICD-10-CM | POA: Diagnosis not present

## 2018-10-10 DIAGNOSIS — O368330 Maternal care for abnormalities of the fetal heart rate or rhythm, third trimester, not applicable or unspecified: Secondary | ICD-10-CM | POA: Diagnosis not present

## 2018-10-10 DIAGNOSIS — Z7984 Long term (current) use of oral hypoglycemic drugs: Secondary | ICD-10-CM | POA: Insufficient documentation

## 2018-10-10 DIAGNOSIS — Z79899 Other long term (current) drug therapy: Secondary | ICD-10-CM | POA: Diagnosis not present

## 2018-10-10 DIAGNOSIS — O34219 Maternal care for unspecified type scar from previous cesarean delivery: Secondary | ICD-10-CM | POA: Insufficient documentation

## 2018-10-10 DIAGNOSIS — Z98891 History of uterine scar from previous surgery: Secondary | ICD-10-CM

## 2018-10-10 DIAGNOSIS — O24410 Gestational diabetes mellitus in pregnancy, diet controlled: Secondary | ICD-10-CM | POA: Diagnosis not present

## 2018-10-10 DIAGNOSIS — O368390 Maternal care for abnormalities of the fetal heart rate or rhythm, unspecified trimester, not applicable or unspecified: Secondary | ICD-10-CM

## 2018-10-10 DIAGNOSIS — Z3493 Encounter for supervision of normal pregnancy, unspecified, third trimester: Secondary | ICD-10-CM

## 2018-10-10 DIAGNOSIS — Z3A37 37 weeks gestation of pregnancy: Secondary | ICD-10-CM | POA: Insufficient documentation

## 2018-10-10 LAB — POCT URINALYSIS DIPSTICK OB
Bilirubin, UA: NEGATIVE
Blood, UA: NEGATIVE
Glucose, UA: NEGATIVE
Ketones, UA: NEGATIVE
Leukocytes, UA: NEGATIVE
Nitrite, UA: NEGATIVE
POC,PROTEIN,UA: NEGATIVE
Spec Grav, UA: 1.02 (ref 1.010–1.025)
Urobilinogen, UA: 0.2 E.U./dL
pH, UA: 5 (ref 5.0–8.0)

## 2018-10-10 NOTE — Patient Instructions (Signed)
Braxton Hicks Contractions Contractions of the uterus can occur throughout pregnancy, but they are not always a sign that you are in labor. You may have practice contractions called Braxton Hicks contractions. These false labor contractions are sometimes confused with true labor. What are Braxton Hicks contractions? Braxton Hicks contractions are tightening movements that occur in the muscles of the uterus before labor. Unlike true labor contractions, these contractions do not result in opening (dilation) and thinning of the cervix. Toward the end of pregnancy (32-34 weeks), Braxton Hicks contractions can happen more often and may become stronger. These contractions are sometimes difficult to tell apart from true labor because they can be very uncomfortable. You should not feel embarrassed if you go to the hospital with false labor. Sometimes, the only way to tell if you are in true labor is for your health care provider to look for changes in the cervix. The health care provider will do a physical exam and may monitor your contractions. If you are not in true labor, the exam should show that your cervix is not dilating and your water has not broken. If there are no other health problems associated with your pregnancy, it is completely safe for you to be sent home with false labor. You may continue to have Braxton Hicks contractions until you go into true labor. How to tell the difference between true labor and false labor True labor  Contractions last 30-70 seconds.  Contractions become very regular.  Discomfort is usually felt in the top of the uterus, and it spreads to the lower abdomen and low back.  Contractions do not go away with walking.  Contractions usually become more intense and increase in frequency.  The cervix dilates and gets thinner. False labor  Contractions are usually shorter and not as strong as true labor contractions.  Contractions are usually irregular.  Contractions  are often felt in the front of the lower abdomen and in the groin.  Contractions may go away when you walk around or change positions while lying down.  Contractions get weaker and are shorter-lasting as time goes on.  The cervix usually does not dilate or become thin. Follow these instructions at home:   Take over-the-counter and prescription medicines only as told by your health care provider.  Keep up with your usual exercises and follow other instructions from your health care provider.  Eat and drink lightly if you think you are going into labor.  If Braxton Hicks contractions are making you uncomfortable: ? Change your position from lying down or resting to walking, or change from walking to resting. ? Sit and rest in a tub of warm water. ? Drink enough fluid to keep your urine pale yellow. Dehydration may cause these contractions. ? Do slow and deep breathing several times an hour.  Keep all follow-up prenatal visits as told by your health care provider. This is important. Contact a health care provider if:  You have a fever.  You have continuous pain in your abdomen. Get help right away if:  Your contractions become stronger, more regular, and closer together.  You have fluid leaking or gushing from your vagina.  You pass blood-tinged mucus (bloody show).  You have bleeding from your vagina.  You have low back pain that you never had before.  You feel your baby's head pushing down and causing pelvic pressure.  Your baby is not moving inside you as much as it used to. Summary  Contractions that occur before labor are   called Braxton Hicks contractions, false labor, or practice contractions.  Braxton Hicks contractions are usually shorter, weaker, farther apart, and less regular than true labor contractions. True labor contractions usually become progressively stronger and regular, and they become more frequent.  Manage discomfort from Braxton Hicks contractions  by changing position, resting in a warm bath, drinking plenty of water, or practicing deep breathing. This information is not intended to replace advice given to you by your health care provider. Make sure you discuss any questions you have with your health care provider. Document Released: 08/11/2016 Document Revised: 03/10/2017 Document Reviewed: 08/11/2016 Elsevier Patient Education  2020 Elsevier Inc.  

## 2018-10-10 NOTE — OB Triage Note (Signed)
Patient seen earlier today at [facility]. Sent to L&D for extended monitoring due to decelerations noted on NST during visit.  Denies vaginal bleeding, leaking of fluid or decreased fetal movement.

## 2018-10-10 NOTE — Discharge Instructions (Signed)
Nonstress Test A nonstress test is a procedure that is done during pregnancy in order to check the baby's heartbeat. The procedure can help show if the baby (fetus) is healthy. It is commonly done when:  The baby is past his or her due date.  The pregnancy is high risk.  The baby is moving less than normal.  The mother has lost a pregnancy in the past.  The health care provider suspects a problem with the baby's growth.  There is too much or too little amniotic fluid. The procedure is often done in the third trimester of pregnancy to find out if an early delivery is needed and whether such a delivery is safe. During a nonstress test, the baby's heartbeat is monitored when the baby is resting and when the baby is moving. If the baby is healthy, the heart rate will increase when he or she moves or kicks and will return to normal when he or she rests. Tell a health care provider about:  Any allergies you have.  Any medical conditions you have.  All medicines you are taking, including vitamins, herbs, eye drops, creams, and over-the-counter medicines. What are the risks? There are no risks to you or your baby from a nonstress test. This procedure should not be painful or uncomfortable. What happens before the procedure?  Eat a meal right before the test or as directed by your health care provider. Food may help encourage the baby to move.  Use the restroom right before the test. What happens during the procedure?  Two monitors will be placed on your abdomen. One will record the baby's heart rate and the other will record the contractions of your uterus.  You may be asked to lie down on your side or to sit upright.  You may be given a button to press when you feel your baby move.  Your health care provider will listen to your baby's heartbeat and recorded it. He or she may also watch your baby's heartbeat on a screen.  If the baby seems to be sleeping, you may be asked to drink  some juice or soda, eat a snack, or change positions. The procedure may vary among health care providers and hospitals. What happens after the procedure?  Your health care provider will discuss the test results with you and make recommendations for the future. Depending on the results, your health care provider may order additional tests or another course of action.  If your health care provider gave you any diet or activity instructions, make sure to follow them.  Keep all follow-up visits as told by your health care provider. This is important. Summary  A nonstress test is a procedure that is done during pregnancy in order to check the baby's heartbeat. The procedure can help show if the baby is healthy.  The procedure is often done in the third trimester of pregnancy to find out if an early delivery is needed and whether such a delivery is safe.  During a nonstress test, the baby's heartbeat is monitored when the baby is resting and when the baby is moving. If the baby is healthy, the heart rate will increase when he or she moves or kicks and will return to normal when he or she rests.  Your health care provider will discuss the test results with you and make recommendations for the future. This information is not intended to replace advice given to you by your health care provider. Make sure you discuss any   questions you have with your health care provider. Document Released: 03/18/2002 Document Revised: 07/07/2016 Document Reviewed: 07/07/2016 Elsevier Patient Education  2020 Else[employer] Inc. Fetal Movement Counts Patient Name: ________________________________________________ Patient Due Date: ____________________ What is a fetal movement count?  A fetal movement count is the number of times that you feel your baby move during a certain amount of time. This may also be called a fetal kick count. A fetal movement count is recommended for every pregnant woman. You may be asked to start  counting fetal movements as early as week 28 of your pregnancy. Pay attention to when your baby is most active. You may notice your baby's sleep and wake cycles. You may also notice things that make your baby move more. You should do a fetal movement count:  When your baby is normally most active.  At the same time each day. A good time to count movements is while you are resting, after having something to eat and drink. How do I count fetal movements? 1. Find a quiet, comfortable area. Sit, or lie down on your side. 2. Write down the date, the start time and stop time, and the number of movements that you felt between those two times. Take this information with you to your health care visits. 3. For 2 hours, count kicks, flutters, swishes, rolls, and jabs. You should feel at least 10 movements during 2 hours. 4. You may stop counting after you have felt 10 movements. 5. If you do not feel 10 movements in 2 hours, have something to eat and drink. Then, keep resting and counting for 1 hour. If you feel at least 4 movements during that hour, you may stop counting. Contact a health care provider if:  You feel fewer than 4 movements in 2 hours.  Your baby is not moving like he or she usually does. Date: ____________ Start time: ____________ Stop time: ____________ Movements: ____________ Date: ____________ Start time: ____________ Stop time: ____________ Movements: ____________ Date: ____________ Start time: ____________ Stop time: ____________ Movements: ____________ Date: ____________ Start time: ____________ Stop time: ____________ Movements: ____________ Date: ____________ Start time: ____________ Stop time: ____________ Movements: ____________ Date: ____________ Start time: ____________ Stop time: ____________ Movements: ____________ Date: ____________ Start time: ____________ Stop time: ____________ Movements: ____________ Date: ____________ Start time: ____________ Stop time: ____________  Movements: ____________ Date: ____________ Start time: ____________ Stop time: ____________ Movements: ____________ This information is not intended to replace advice given to you by your health care provider. Make sure you discuss any questions you have with your health care provider. Document Released: 04/27/2006 Document Revised: 04/17/2018 Document Reviewed: 05/07/2015 Elsevier Patient Education  2020 Else[employer] Inc.  

## 2018-10-10 NOTE — Progress Notes (Signed)
ROB doing well. Has scheduled c/section on 7/17 @0730 . NST today : Baseline 140 initially, went to 150's with decrease in variability. Deceleration noted to 90s for 90 seconds. Variability then returns to moderate for approximately 5 minutes. Then the  Baseline begins to increase into 160s, variability decreased again to minimal.  No contractions noted. Pt state that her Blood sugars fasting less 85 , 2 hr pp less than 120 . Pt instructed sent to L&D for prolonged monitoring. She is instructed to go straight to L&D and not to go home first. She verbalizes and agrees to plan.   Philip Aspen, CNM

## 2018-10-11 NOTE — Discharge Summary (Signed)
Obstetric Discharge Summary  Patient ID: Tasha Rodriguez MRN: [id number] DOB/AGE: [birth year] 38 y.o.   Date of Admission: 10/10/2018  Date of Discharge: 10/10/2018  Admitting Diagnosis: Observation at [redacted]w[redacted]d Secondary Diagnosis: Previous cesarean section, Non reassuring electronic fetal heart rate tracing, Gestational diabetes-oral medication    Discharge Diagnosis: No other diagnosis   Antepartum Procedures: Biophysical Profile and Prolonged Fetal Monitoring    Brief Hospital Course   L&D OB Triage Note  Tasha Rodriguez a 38 y.o. [id number] at 325w2dEDD Estimated Date of Delivery: 10/30/18 who presented to triage from office for prolonged fetal monitoring and biophysical profile after nonrassuring fetal heart rate tracing.  She was evaluated by the nurses with no significant findings for labor or fetal distress. Vital signs stable.   NST INTERPRETATION:  Indications: gestational diabetes mellitus  Mode: External Baseline Rate (A): 140 bpm Variability: Moderate Accelerations: 15 x 15 Decelerations: None Contraction Frequency (min): Occasional  Impression: reactive   Plan: NST performed was reviewed and was found to be reactive. Biophysical Profile was 8/8. Encouraged oral hydration. She was discharged home with bleeding/labor precautions.  Continue routine prenatal care. [facility].   Discharge Instructions: Per After Visit Summary.  Activity: Also refer to After Visit Summary.   Diet: Regular  Medications: Allergies as of 10/10/2018   No Known Allergies     Medication List    TAKE these medications   Accu-Chek FastClix Lancets Misc 1 applicator by Percutaneous route 4 (four) times daily.   blood glucose meter kit and supplies Dispense based on patient and insurance preference. Use up to four times daily as directed. (FOR ICD-10 E10.9, E11.9).   cefdinir 300 MG capsule Commonly  known as: OMNICEF Take 1 capsule (300 mg total) by mouth 2 (two) times daily.   cholecalciferol 1000 units tablet Commonly known as: VITAMIN D Take 1,000 Units by mouth daily.   glucose blood test strip Commonly known as: Accu-Chek Aviva Use as instructed   glyBURIDE 2.5 MG tablet Commonly known as: DIABETA Take 1 tablet (2.5 mg total) by mouth at bedtime.   prenatal multivitamin Tabs tablet Take 1 tablet by mouth daily at 12 noon.      Outpatient follow up:  Follow-up Information    [facility]. Go in 3 day(s).   Specialty: Obstetrics and Gynecology Why: Please return Saturday for NST or sooner if needed Contact information: [id number] [facility]     ShJoylene IgoCNNorth DakotaSchedule an appointment as soon as possible for a visit in 1 week(s).   Specialties: Obstetrics and Gynecology, Radiology Why: Please schedule NST and ROB next week Contact information: [address] [phone]         Discharged Condition: stable  Discharged to: home   JeDiona FantiCNM [facility], [facility]

## 2018-10-13 ENCOUNTER — Encounter: Payer: Self-pay | Admitting: *Deleted

## 2018-10-13 ENCOUNTER — Inpatient Hospital Stay
Admission: EM | Admit: 2018-10-13 | Discharge: 2018-10-13 | Disposition: A | Discharge status: Home / Self Care | Payer: Medicaid Other | Source: Home / Self Care | Admitting: Obstetrics and Gynecology

## 2018-10-13 ENCOUNTER — Other Ambulatory Visit: Payer: Self-pay

## 2018-10-13 DIAGNOSIS — O24415 Gestational diabetes mellitus in pregnancy, controlled by oral hypoglycemic drugs: Secondary | ICD-10-CM | POA: Insufficient documentation

## 2018-10-13 DIAGNOSIS — Z98891 History of uterine scar from previous surgery: Secondary | ICD-10-CM

## 2018-10-13 DIAGNOSIS — Z3A37 37 weeks gestation of pregnancy: Secondary | ICD-10-CM | POA: Insufficient documentation

## 2018-10-13 NOTE — OB Triage Provider Note (Signed)
L&D OB Triage Note  Tasha Rodriguez is a 38 y.o. [id number] female at [redacted]w[redacted]d, EDD Estimated Date of Delivery: 10/30/18 who presented to triage for GDM on meds.  She was evaluated by me with no significant findings/findings significant for fetal concerns. Vital signs stable. An NST was performed and has been reviewed by myself.   NST INTERPRETATION: Indications: gestational diabetes mellitus                   Impression: reactive   Plan: NST performed was reviewed and was found to be reactive. She was discharged home with bleeding/labor precautions.  Continue routine prenatal care. Follow up with OB/GYN as previously scheduled.     [name], CNM

## 2018-10-13 NOTE — OB Triage Note (Signed)
Scheduled NST. Gestational DM. Tasha Rodriguez

## 2018-10-14 ENCOUNTER — Encounter
Admission: EM | Disposition: A | Discharge status: Home / Self Care | Payer: Self-pay | Source: Home / Self Care | Attending: Obstetrics and Gynecology

## 2018-10-14 ENCOUNTER — Other Ambulatory Visit: Payer: Self-pay

## 2018-10-14 ENCOUNTER — Inpatient Hospital Stay
Admission: EM | Admit: 2018-10-14 | Discharge: 2018-10-18 | DRG: 788 | Disposition: A | Discharge status: Home / Self Care | Payer: Medicaid Other | Attending: Obstetrics and Gynecology | Admitting: Obstetrics and Gynecology

## 2018-10-14 ENCOUNTER — Inpatient Hospital Stay: Payer: Medicaid Other | Admitting: Anesthesiology

## 2018-10-14 DIAGNOSIS — O368130 Decreased fetal movements, third trimester, not applicable or unspecified: Secondary | ICD-10-CM | POA: Diagnosis present

## 2018-10-14 DIAGNOSIS — Z1159 Encounter for screening for other viral diseases: Secondary | ICD-10-CM | POA: Diagnosis not present

## 2018-10-14 DIAGNOSIS — O24425 Gestational diabetes mellitus in childbirth, controlled by oral hypoglycemic drugs: Secondary | ICD-10-CM | POA: Diagnosis present

## 2018-10-14 DIAGNOSIS — Z98891 History of uterine scar from previous surgery: Secondary | ICD-10-CM

## 2018-10-14 DIAGNOSIS — Z87410 Personal history of cervical dysplasia: Secondary | ICD-10-CM | POA: Diagnosis not present

## 2018-10-14 DIAGNOSIS — E669 Obesity, unspecified: Secondary | ICD-10-CM | POA: Diagnosis present

## 2018-10-14 DIAGNOSIS — Z3A37 37 weeks gestation of pregnancy: Secondary | ICD-10-CM | POA: Diagnosis not present

## 2018-10-14 DIAGNOSIS — O368390 Maternal care for abnormalities of the fetal heart rate or rhythm, unspecified trimester, not applicable or unspecified: Secondary | ICD-10-CM | POA: Diagnosis present

## 2018-10-14 DIAGNOSIS — O34219 Maternal care for unspecified type scar from previous cesarean delivery: Secondary | ICD-10-CM | POA: Diagnosis not present

## 2018-10-14 DIAGNOSIS — O34211 Maternal care for low transverse scar from previous cesarean delivery: Secondary | ICD-10-CM | POA: Diagnosis present

## 2018-10-14 DIAGNOSIS — O99214 Obesity complicating childbirth: Secondary | ICD-10-CM | POA: Diagnosis present

## 2018-10-14 DIAGNOSIS — O368190 Decreased fetal movements, unspecified trimester, not applicable or unspecified: Secondary | ICD-10-CM

## 2018-10-14 LAB — TYPE AND SCREEN
ABO/RH(D): B POS
Antibody Screen: NEGATIVE

## 2018-10-14 LAB — CBC WITH DIFFERENTIAL/PLATELET
Abs Immature Granulocytes: 0.05 10*3/uL (ref 0.00–0.07)
Basophils Absolute: 0 10*3/uL (ref 0.0–0.1)
Basophils Relative: 0 %
Eosinophils Absolute: 0.5 10*3/uL (ref 0.0–0.5)
Eosinophils Relative: 5 %
HCT: 33.7 % — ABNORMAL LOW (ref 36.0–46.0)
Hemoglobin: 11.3 g/dL — ABNORMAL LOW (ref 12.0–15.0)
Immature Granulocytes: 0 %
Lymphocytes Relative: 15 %
Lymphs Abs: 1.7 10*3/uL (ref 0.7–4.0)
MCH: 30.4 pg (ref 26.0–34.0)
MCHC: 33.5 g/dL (ref 30.0–36.0)
MCV: 90.6 fL (ref 80.0–100.0)
Monocytes Absolute: 0.6 10*3/uL (ref 0.1–1.0)
Monocytes Relative: 5 %
Neutro Abs: 8.4 10*3/uL — ABNORMAL HIGH (ref 1.7–7.7)
Neutrophils Relative %: 75 %
Platelets: 181 10*3/uL (ref 150–400)
RBC: 3.72 MIL/uL — ABNORMAL LOW (ref 3.87–5.11)
RDW: 13.7 % (ref 11.5–15.5)
WBC: 11.3 10*3/uL — ABNORMAL HIGH (ref 4.0–10.5)
nRBC: 0 % (ref 0.0–0.2)

## 2018-10-14 LAB — URINE DRUG SCREEN, QUALITATIVE (ARMC ONLY)
Amphetamines, Ur Screen: NOT DETECTED
Barbiturates, Ur Screen: NOT DETECTED
Benzodiazepine, Ur Scrn: NOT DETECTED
Cannabinoid 50 Ng, Ur ~~LOC~~: NOT DETECTED
Cocaine Metabolite,Ur ~~LOC~~: NOT DETECTED
MDMA (Ecstasy)Ur Screen: NOT DETECTED
Methadone Scn, Ur: NOT DETECTED
Opiate, Ur Screen: NOT DETECTED
Phencyclidine (PCP) Ur S: NOT DETECTED
Tricyclic, Ur Screen: NOT DETECTED

## 2018-10-14 LAB — SARS CORONAVIRUS 2 BY RT PCR (HOSPITAL ORDER, PERFORMED IN ~~LOC~~ HOSPITAL LAB): SARS Coronavirus 2: NEGATIVE

## 2018-10-14 LAB — GLUCOSE, CAPILLARY: Glucose-Capillary: 73 mg/dL (ref 70–99)

## 2018-10-14 SURGERY — Surgical Case
Anesthesia: Spinal

## 2018-10-14 MED ORDER — OXYTOCIN 40 UNITS IN NORMAL SALINE INFUSION - SIMPLE MED
INTRAVENOUS | Status: AC
  Filled 2018-10-14: qty 1000

## 2018-10-14 MED ORDER — MORPHINE SULFATE (PF) 0.5 MG/ML IJ SOLN
INTRAMUSCULAR | Status: DC | PRN
  Administered 2018-10-14: .2 mg via EPIDURAL

## 2018-10-14 MED ORDER — BUPIVACAINE IN DEXTROSE 0.75-8.25 % IT SOLN
INTRATHECAL | Status: DC | PRN
  Administered 2018-10-14: 1.5 mL via INTRATHECAL

## 2018-10-14 MED ORDER — SODIUM CHLORIDE 0.9 % IV SOLN: INTRAVENOUS | Status: DC | PRN

## 2018-10-14 MED ORDER — LIDOCAINE 5 % EX PTCH
MEDICATED_PATCH | CUTANEOUS | Status: DC | PRN
  Administered 2018-10-14: 1 via TRANSDERMAL

## 2018-10-14 MED ORDER — LACTATED RINGERS IV BOLUS
500.0000 mL | Freq: Once | INTRAVENOUS | Status: AC
  Administered 2018-10-14: 500 mL via INTRAVENOUS

## 2018-10-14 MED ORDER — SODIUM CHLORIDE 0.9 % IV SOLN
INTRAVENOUS | Status: DC | PRN
  Administered 2018-10-14: 50 ug/min via INTRAVENOUS

## 2018-10-14 MED ORDER — SODIUM CHLORIDE 0.9 % IV SOLN
INTRAVENOUS | Status: DC | PRN
  Administered 2018-10-14: 200 ug via INTRAVENOUS

## 2018-10-14 MED ORDER — SOD CITRATE-CITRIC ACID 500-334 MG/5ML PO SOLN
30.0000 mL | ORAL | Status: AC
  Administered 2018-10-14: 30 mL via ORAL

## 2018-10-14 MED ORDER — CEFAZOLIN SODIUM-DEXTROSE 2-4 GM/100ML-% IV SOLN
2.0000 g | INTRAVENOUS | Status: AC
  Administered 2018-10-14: 2 g via INTRAVENOUS
  Filled 2018-10-14: qty 100

## 2018-10-14 MED ORDER — EPHEDRINE SULFATE-NACL 50-0.9 MG/10ML-% IV SOSY
PREFILLED_SYRINGE | INTRAVENOUS | Status: DC | PRN
  Administered 2018-10-14: 10 mg via INTRAVENOUS

## 2018-10-14 MED ORDER — OXYTOCIN 40 UNITS IN NORMAL SALINE INFUSION - SIMPLE MED
INTRAVENOUS | Status: DC | PRN
  Administered 2018-10-14: 500 mL via INTRAVENOUS

## 2018-10-14 MED ORDER — LIDOCAINE 5 % EX PTCH
MEDICATED_PATCH | CUTANEOUS | Status: AC
  Filled 2018-10-14: qty 1

## 2018-10-14 MED ORDER — LACTATED RINGERS IV SOLN
INTRAVENOUS | Status: DC
  Administered 2018-10-14: 20:00:00 via INTRAVENOUS

## 2018-10-14 MED ORDER — SOD CITRATE-CITRIC ACID 500-334 MG/5ML PO SOLN
30.0000 mL | ORAL | Status: DC
  Filled 2018-10-14: qty 30

## 2018-10-14 MED ORDER — CEFAZOLIN SODIUM-DEXTROSE 2-4 GM/100ML-% IV SOLN
2.0000 g | INTRAVENOUS | Status: DC
  Filled 2018-10-14: qty 100

## 2018-10-14 MED ORDER — MORPHINE SULFATE (PF) 0.5 MG/ML IJ SOLN
INTRAMUSCULAR | Status: AC
  Filled 2018-10-14: qty 10

## 2018-10-14 SURGICAL SUPPLY — 30 items
BAG COUNTER SPONGE EZ (MISCELLANEOUS) ×2 IMPLANT
CANISTER SUCT 3000ML PPV (MISCELLANEOUS) ×3 IMPLANT
CHLORAPREP W/TINT 26 (MISCELLANEOUS) ×6 IMPLANT
CLOSURE WOUND 1/2 X4 (GAUZE/BANDAGES/DRESSINGS) ×1
COUNTER SPONGE BAG EZ (MISCELLANEOUS) ×1
COVER WAND RF STERILE (DRAPES) ×3 IMPLANT
DRSG TELFA 3X8 NADH (GAUZE/BANDAGES/DRESSINGS) ×3 IMPLANT
ELECT REM PT RETURN 9FT ADLT (ELECTROSURGICAL) ×3
ELECTRODE REM PT RTRN 9FT ADLT (ELECTROSURGICAL) ×1 IMPLANT
EXTRT SYSTEM ALEXIS 17CM (MISCELLANEOUS)
GAUZE SPONGE 4X4 12PLY STRL (GAUZE/BANDAGES/DRESSINGS) ×3 IMPLANT
GLOVE BIO SURGEON STRL SZ 6.5 (GLOVE) ×2 IMPLANT
GLOVE BIO SURGEONS STRL SZ 6.5 (GLOVE) ×1
GLOVE INDICATOR 7.0 STRL GRN (GLOVE) ×3 IMPLANT
GOWN STRL REUS W/ TWL LRG LVL3 (GOWN DISPOSABLE) ×2 IMPLANT
GOWN STRL REUS W/TWL LRG LVL3 (GOWN DISPOSABLE) ×4
KIT TURNOVER KIT A (KITS) ×3 IMPLANT
NS IRRIG 1000ML POUR BTL (IV SOLUTION) ×3 IMPLANT
PACK C SECTION AR (MISCELLANEOUS) ×3 IMPLANT
PAD OB MATERNITY 4.3X12.25 (PERSONAL CARE ITEMS) ×3 IMPLANT
PAD PREP 24X41 OB/GYN DISP (PERSONAL CARE ITEMS) ×3 IMPLANT
PENCIL SMOKE ULTRAEVAC 22 CON (MISCELLANEOUS) ×3 IMPLANT
RTRCTR C-SECT PINK 25CM LRG (MISCELLANEOUS) ×3 IMPLANT
STRIP CLOSURE SKIN 1/2X4 (GAUZE/BANDAGES/DRESSINGS) ×2 IMPLANT
SUT MNCRL AB 4-0 PS2 18 (SUTURE) ×3 IMPLANT
SUT PLAIN 2 0 XLH (SUTURE) IMPLANT
SUT VIC AB 0 CT1 36 (SUTURE) ×12 IMPLANT
SUT VIC AB 3-0 SH 27 (SUTURE) ×2
SUT VIC AB 3-0 SH 27X BRD (SUTURE) ×1 IMPLANT
SYSTEM CONTND EXTRCTN KII BLLN (MISCELLANEOUS) IMPLANT

## 2018-10-14 NOTE — Progress Notes (Signed)
Tasha Rodriguez is a 38 y.o. [id number] at [redacted]w[redacted]d by LMP admitted for fetal monitoring, and repeat LTCS due tyo non-reassuring fetal heart tracing.  Subjective: Denies pain, still does not feel fetal movement  Objective: BP 121/74 (BP Location: Left Arm)   Pulse 76   Temp 98.2 F (36.8 C) (Oral)   Resp 16   Ht 5\' 10"  (1.778 m)   Wt 112.9 kg   LMP 01/23/2018 (Approximate)   BMI 35.73 kg/m  No intake/output data recorded. No intake/output data recorded.  FHT:  FHR: 164 bpm, variability: minimal ,  accelerations:  Abscent,  decelerations:  Present variable decels noted spontaneously and after Fetal stimulator applied UC:   none SVE:      Labs: Lab Results  Component Value Date   WBC 10.7 04/18/2018   HGB 12.8 04/18/2018   HCT 38.7 04/18/2018   MCV 89 04/18/2018   PLT 231 11/23/2016    Assessment / Plan: non-reassuring fetal heart rate tracing, GDM on oral medication, Dr Amalia Hailey aware and reviewed strip, agrees with plan of care to proceed with delivery of infant.  Labor: NA Preeclampsia:  NA Fetal Wellbeing:  Category III Pain Control:  Labor support without medications I/D:  Tasha/a Anticipated MOD:  repeat LTCS  Tasha Rodriguez Tasha Rodriguez 10/14/2018, 9:01 PM

## 2018-10-14 NOTE — H&P (Signed)
History and Physical   HPI  Tasha Rodriguez is a 38 y.o. P6P9509 at 53w5dEstimated Date of Delivery: 10/30/18 who is being admitted for decreased fetal movement for 4 days with non-reassuring FHR tracing and decelerations.   OB History  OB History  Gravida Para Term Preterm AB Living  '4 2 2 '$ 0 1 2  SAB TAB Ectopic Multiple Live Births  1 0 0 0 2    # Outcome Date GA Lbr Len/2nd Weight Sex Delivery Anes PTL Lv  4 Current           3 SAB 2018 728w0d       2 Term 2011   3317 g F CS-Unspec  N LIV  1 Term 11/12/99   3447 g F CS-Unspec   LIV    PROBLEM LIST  Pregnancy complications or risks: Patient Active Problem List   Diagnosis Date Noted  . Decreased fetal movement affecting management of mother, antepartum 10/14/2018  . Non-reassuring fetal heart rate or rhythm affecting management of mother 10/14/2018  . Non-reassuring electronic fetal monitoring tracing 10/14/2018  . Indication for care in labor and delivery, antepartum 10/10/2018  . Obesity in pregnancy 09/11/2018  . Previous cesarean section 07/23/2018  . Maternal varicella, non-immune 07/23/2018  . Antepartum multigravida of advanced maternal age 64/13/2020  . Gestational diabetes 06/27/2018  . BMI 33.0-33.9,adult 06/27/2018  . First trimester bleeding 12/05/2016     Prenatal labs and studies: ABO, Rh: --/--/PENDING (07/05 2117) Antibody: PENDING (07/05 2117) Rubella: 7.62 (01/08 1003) RPR: Non Reactive (01/08 1003)  HBsAg: Negative (01/08 1003)  HIV: Non Reactive (01/08 1003)  GBTOI:[id number]/25 1650)   Past Medical History:  Diagnosis Date  . Cervical dysplasia 2010  . Diabetes mellitus without complication ([facility]. [place]  . HPV (human papilloma virus) infection      Past Surgical History:  Procedure Laterality Date  . CESAREAN SECTION    . LEEP  2010     Medications    Current Discharge Medication List    CONTINUE these medications which have NOT CHANGED   Details  cholecalciferol  (VITAMIN D) 1000 units tablet Take 1,000 Units by mouth daily.    glyBURIDE (DIABETA) 2.5 MG tablet Take 1 tablet (2.5 mg total) by mouth at bedtime. Qty: 30 tablet, Refills: 2    Prenatal Vit-Fe Fumarate-FA (PRENATAL MULTIVITAMIN) TABS tablet Take 1 tablet by mouth daily at 12 noon.    Accu-Chek FastClix Lancets MISC 1 applicator by Percutaneous route 4 (four) times daily. Qty: 100 each, Refills: 12    blood glucose meter kit and supplies Dispense based on patient and insurance preference. Use up to four times daily as directed. (FOR ICD-10 E10.9, E11.9). Qty: 1 each, Refills: 0   Comments: accu chek meter    cefdinir (OMNICEF) 300 MG capsule Take 1 capsule (300 mg total) by mouth 2 (two) times daily. Qty: 14 capsule, Refills: 0    glucose blood (ACCU-CHEK AVIVA) test strip Use as instructed Qty: 100 each, Refills: 12         Allergies  Patient has no known allergies.  Review of Systems  Pertinent items are noted in HPI.  Physical Exam  BP 121/74 (BP Location: Left Arm)   Pulse 76   Temp 98.2 F (36.8 C) (Oral)   Resp 16   Ht '5\' 10"'$  (1.778 m)   Wt 112.9 kg   LMP 01/23/2018 (Approximate)   BMI 35.73 kg/m   Lungs:  CTA  B Cardio: RRR without M/R/G Abd: Soft, gravid, NT Presentation: cephalic EXT: No C/C/ 1+ Edema DTRs: 2+ B CERVIX:  NA   See Prenatal records for more detailed PE.    FHR:  Variability: Absent, Accelerations: none and Decelerations: Variable: mild  Toco: Uterine Contractions: None   Test Results  Results for orders placed or performed during the hospital encounter of 10/14/18 (from the past 24 hour(s))  Glucose, capillary     Status: None   Collection Time: 10/14/18  7:42 PM  Result Value Ref Range   Glucose-Capillary 73 70 - 99 mg/dL  Type and screen [place]     Status: None (Preliminary result)   Collection Time: 10/14/18  9:17 PM  Result Value Ref Range   ABO/RH(D) PENDING    Antibody Screen PENDING     Sample Expiration      10/17/2018,2359 Performed at [facility], [address] [place], [place] [address]   CBC with Differential     Status: Abnormal   Collection Time: 10/14/18  9:17 PM  Result Value Ref Range   WBC 11.3 (H) 4.0 - 10.5 K/uL   RBC 3.72 (L) 3.87 - 5.11 MIL/uL   Hemoglobin 11.3 (L) 12.0 - 15.0 g/dL   HCT 33.7 (L) 36.0 - 46.0 %   MCV 90.6 80.0 - 100.0 fL   MCH 30.4 26.0 - 34.0 pg   MCHC 33.5 30.0 - 36.0 g/dL   RDW 13.7 11.5 - 15.5 %   Platelets 181 150 - 400 K/uL   nRBC 0.0 0.0 - 0.2 %   Neutrophils Relative % 75 %   Neutro Abs 8.4 (H) 1.7 - 7.7 K/uL   Lymphocytes Relative 15 %   Lymphs Abs 1.7 0.7 - 4.0 K/uL   Monocytes Relative 5 %   Monocytes Absolute 0.6 0.1 - 1.0 K/uL   Eosinophils Relative 5 %   Eosinophils Absolute 0.5 0.0 - 0.5 K/uL   Basophils Relative 0 %   Basophils Absolute 0.0 0.0 - 0.1 K/uL   Immature Granulocytes 0 %   Abs Immature Granulocytes 0.05 0.00 - 0.07 K/uL  Urine Drug Screen, Qualitative ([facility] only)     Status: None   Collection Time: 10/14/18  9:18 PM  Result Value Ref Range   Tricyclic, Ur Screen NONE DETECTED NONE DETECTED   Amphetamines, Ur Screen NONE DETECTED NONE DETECTED   MDMA (Ecstasy)Ur Screen NONE DETECTED NONE DETECTED   Cocaine Metabolite,Ur [place] NONE DETECTED NONE DETECTED   Opiate, Ur Screen NONE DETECTED NONE DETECTED   Phencyclidine (PCP) Ur S NONE DETECTED NONE DETECTED   Cannabinoid 50 Ng, Ur [place] NONE DETECTED NONE DETECTED   Barbiturates, Ur Screen NONE DETECTED NONE DETECTED   Benzodiazepine, Ur Scrn NONE DETECTED NONE DETECTED   Methadone Scn, Ur NONE DETECTED NONE DETECTED    Assessment  G4P2012 at 17w5dEstimated Date of Delivery: 10/30/18  The fetus is not reassuring.  Pt with two proir CD and previously scheduled for repeat Gestational DM  Patient Active Problem List   Diagnosis Date Noted  . Decreased fetal movement affecting management of mother, antepartum 10/14/2018  .  Non-reassuring fetal heart rate or rhythm affecting management of mother 10/14/2018  . Non-reassuring electronic fetal monitoring tracing 10/14/2018  . Indication for care in labor and delivery, antepartum 10/10/2018  . Obesity in pregnancy 09/11/2018  . Previous cesarean section 07/23/2018  . Maternal varicella, non-immune 07/23/2018  . Antepartum multigravida of advanced maternal age 06/22/2018  . Gestational  diabetes 06/27/2018  . BMI 33.0-33.9,adult 06/27/2018  . First trimester bleeding 12/05/2016    Plan  1. Admit to L&D :    2. EFM: -- Category 2 3. Spinal 4. Admission labs  5. Prep for CD  Finis Bud, M.D. 10/14/2018 10:02 PM

## 2018-10-14 NOTE — Anesthesia Procedure Notes (Signed)
Date/Time: 10/14/2018 10:44 PM Performed by: Johnna Acosta, CRNA Pre-anesthesia Checklist: Patient identified, Emergency Drugs available, Suction available, Patient being monitored and Timeout performed Patient Re-evaluated:Patient Re-evaluated prior to induction Oxygen Delivery Method: Nasal cannula Preoxygenation: Pre-oxygenation with 100% oxygen

## 2018-10-14 NOTE — Transfer of Care (Signed)
Immediate Anesthesia Transfer of Care Note  Patient: Tasha Rodriguez  Procedure(s) Performed: CESAREAN SECTION (N/A )  Patient Location: Mother/Baby  Anesthesia Type:Spinal  Level of Consciousness: awake, alert  and oriented  Airway & Oxygen Therapy: Patient Spontanous Breathing  Post-op Assessment: Report given to RN and Post -op Vital signs reviewed and stable  Post vital signs: Reviewed  Last Vitals:  Vitals Value Taken Time  BP 124/68 10/14/18 2351  Temp 36.7 C 10/14/18 2351  Pulse 68 10/14/18 2351  Resp 14 10/14/18 2351  SpO2 98 % 10/14/18 2351    Last Pain:  Vitals:   10/14/18 1911  TempSrc: Oral  PainSc: 0-No pain         Complications: No apparent anesthesia complications

## 2018-10-14 NOTE — Anesthesia Post-op Follow-up Note (Signed)
Anesthesia QCDR form completed.        

## 2018-10-14 NOTE — Anesthesia Preprocedure Evaluation (Signed)
Anesthesia Evaluation  Patient identified by MRN, date of birth, ID band Patient awake    Reviewed: Allergy & Precautions, NPO status , Patient's Chart, lab work & pertinent test results, reviewed documented beta blocker date and time   Airway Mallampati: III  TM Distance: >3 FB     Dental  (+) Chipped   Pulmonary           Cardiovascular      Neuro/Psych    GI/Hepatic   Endo/Other  diabetes, Type 2  Renal/GU      Musculoskeletal   Abdominal   Peds  Hematology   Anesthesia Other Findings Obese.  Reproductive/Obstetrics                             Anesthesia Physical Anesthesia Plan  ASA: II  Anesthesia Plan: Spinal   Post-op Pain Management:    Induction: Intravenous  PONV Risk Score and Plan:   Airway Management Planned:   Additional Equipment:   Intra-op Plan:   Post-operative Plan:   Informed Consent: I have reviewed the patients History and Physical, chart, labs and discussed the procedure including the risks, benefits and alternatives for the proposed anesthesia with the patient or authorized representative who has indicated his/her understanding and acceptance.       Plan Discussed with: CRNA  Anesthesia Plan Comments:         Anesthesia Quick Evaluation

## 2018-10-14 NOTE — Interval H&P Note (Signed)
History and Physical Interval Note:  10/14/2018 10:06 PM  Tasha Rodriguez  has presented today for surgery, with the diagnosis of n/a.  The various methods of treatment have been discussed with the patient and family. After consideration of risks, benefits and other options for treatment, the patient has consented to  Procedure(s): CESAREAN SECTION (N/A) as a surgical intervention.  The patient's history has been reviewed, patient examined, no change in status, stable for surgery.  I have reviewed the patient's chart and labs.  Questions were answered to the patient's satisfaction.     [name]

## 2018-10-14 NOTE — OB Triage Provider Note (Signed)
Obstetric History and Physical  Rettie Louise Kampf is a 38 y.o. G4P2012 with IUP at [redacted]w[redacted]d presenting with decreased fetal movement since early this morning. Patient states she has been having  none contractions, none vaginal bleeding, intact membranes, with decreased  fetal movement.    Prenatal Course Source of Care: [facility]  Pregnancy complications or risks:GDM on glyburide  Prenatal labs and studies: ABO, Rh: B/Positive/-- (01/08 1003) Antibody: Negative (01/08 1003) Rubella: 7.62 (01/08 1003) RPR: Non Reactive (01/08 1003)  HBsAg: Negative (01/08 1003)  HIV: Non Reactive (01/08 1003)  GBS:Negative (06/25 1650) 1 hr Glucola  elevated Genetic screening normal Anatomy US normal  Past Medical History:  Diagnosis Date  . Cervical dysplasia 2010  . Diabetes mellitus without complication ([facility])   . HPV (human papilloma virus) infection     Past Surgical History:  Procedure Laterality Date  . CESAREAN SECTION    . LEEP  2010    OB History  Gravida Para Term Preterm AB Living  4 2 2   1 2  SAB TAB Ectopic Multiple Live Births  1       2    # Outcome Date GA Lbr Len/2nd Weight Sex Delivery Anes PTL Lv  4 Current           3 SAB 2018 [redacted]w[redacted]d         2 Term 2011   3317 g F CS-Unspec  N LIV  1 Term 11/12/99   3447 g F CS-Unspec   LIV    Social History   Socioeconomic History  . Marital status: Single    Spouse name: Not on file  . Number of children: Not on file  . Years of education: Not on file  . Highest education level: Not on file  Occupational History  . Not on file  Social Needs  . Financial resource strain: Not on file  . Food insecurity    Worry: Not on file    Inability: Not on file  . Transportation needs    Medical: Not on file    Non-medical: Not on file  Tobacco Use  . Smoking status: Never Smoker  . Smokeless tobacco: Never Used  Substance and Sexual Activity  . Alcohol use: Not Currently  . Drug use: No  . Sexual activity: Yes    Birth  control/protection: Surgical    Comment: tubal   Lifestyle  . Physical activity    Days per week: Not on file    Minutes per session: Not on file  . Stress: Not on file  Relationships  . Social connections    Talks on phone: Not on file    Gets together: Not on file    Attends religious service: Not on file    Active member of club or organization: Not on file    Attends meetings of clubs or organizations: Not on file    Relationship status: Not on file  Other Topics Concern  . Not on file  Social History Narrative  . Not on file    Family History  Problem Relation Age of Onset  . Diabetes Mother   . Ovarian cancer Mother 25       s/p hyst, no chemo/rad; question cx  . Diabetes Father   . Liver disease Brother   . Diabetes Maternal Aunt   . Diabetes Maternal Uncle   . Diabetes Maternal Grandmother   . Diabetes Maternal Grandfather     Medications Prior to Admission  Medication   Sig Dispense Refill Last Dose  . cholecalciferol (VITAMIN D) 1000 units tablet Take 1,000 Units by mouth daily.   10/13/2018 at Unknown time  . glyBURIDE (DIABETA) 2.5 MG tablet Take 1 tablet (2.5 mg total) by mouth at bedtime. 30 tablet 2 10/13/2018 at Unknown time  . Prenatal Vit-Fe Fumarate-FA (PRENATAL MULTIVITAMIN) TABS tablet Take 1 tablet by mouth daily at 12 noon.   10/13/2018 at Unknown time  . Accu-Chek FastClix Lancets MISC 1 applicator by Percutaneous route 4 (four) times daily. 100 each 12   . blood glucose meter kit and supplies Dispense based on patient and insurance preference. Use up to four times daily as directed. (FOR ICD-10 E10.9, E11.9). 1 each 0   . cefdinir (OMNICEF) 300 MG capsule Take 1 capsule (300 mg total) by mouth 2 (two) times daily. (Patient not taking: Reported on 10/10/2018) 14 capsule 0   . glucose blood (ACCU-CHEK AVIVA) test strip Use as instructed 100 each 12     No Known Allergies  Review of Systems: Negative except for what is mentioned in HPI.  Physical Exam: BP  121/74 (BP Location: Left Arm)   Pulse 76   Temp 98.2 F (36.8 C) (Oral)   Resp 16   Ht 5' 10" (1.778 m)   Wt 112.9 kg   LMP 01/23/2018 (Approximate)   BMI 35.73 kg/m  GENERAL: Well-developed, well-nourished female in no acute distress.  LUNGS: Clear to auscultation bilaterally.  HEART: Regular rate and rhythm. ABDOMEN: Soft, nontender, nondistended, gravid. EXTREMITIES: Nontender, no edema, 2+ distal pulses. Cervical Exam:   FHT:  Baseline rate 160 bpm   Variability minimal or absent  Accelerations absent   Decelerations variable Contractions: Every random with late decels noted down to 110-120s for 60-90 seconds   Pertinent Labs/Studies:   Results for orders placed or performed during the hospital encounter of 10/14/18 (from the past 24 hour(s))  Glucose, capillary     Status: None   Collection Time: 10/14/18  7:42 PM  Result Value Ref Range   Glucose-Capillary 73 70 - 99 mg/dL    Assessment : Telesa Jeancharles is a 38 y.o. [id number] at 8w5dbeing admitted for fetal monitoring with category 2 tracing. Dr EAmalia Haileynotified on admission and is aware of fluid hydration and observation.   Plan:    SGayla Medicus CNM [facility], [facility]

## 2018-10-15 ENCOUNTER — Encounter: Payer: Self-pay | Admitting: Obstetrics and Gynecology

## 2018-10-15 DIAGNOSIS — Z3A37 37 weeks gestation of pregnancy: Secondary | ICD-10-CM

## 2018-10-15 DIAGNOSIS — O34219 Maternal care for unspecified type scar from previous cesarean delivery: Secondary | ICD-10-CM

## 2018-10-15 DIAGNOSIS — O24425 Gestational diabetes mellitus in childbirth, controlled by oral hypoglycemic drugs: Secondary | ICD-10-CM

## 2018-10-15 LAB — GLUCOSE, CAPILLARY
Glucose-Capillary: 127 mg/dL — ABNORMAL HIGH (ref 70–99)
Glucose-Capillary: 75 mg/dL (ref 70–99)
Glucose-Capillary: 80 mg/dL (ref 70–99)
Glucose-Capillary: 90 mg/dL (ref 70–99)
Glucose-Capillary: 93 mg/dL (ref 70–99)

## 2018-10-15 MED ORDER — LACTATED RINGERS IV BOLUS
500.0000 mL | Freq: Once | INTRAVENOUS | Status: AC
  Administered 2018-10-15: 500 mL via INTRAVENOUS

## 2018-10-15 MED ORDER — MENTHOL 3 MG MT LOZG
1.0000 | LOZENGE | OROMUCOSAL | Status: DC | PRN
  Filled 2018-10-15: qty 9

## 2018-10-15 MED ORDER — SENNOSIDES-DOCUSATE SODIUM 8.6-50 MG PO TABS
2.0000 | ORAL_TABLET | ORAL | Status: DC
  Administered 2018-10-15 – 2018-10-18 (×4): 2 via ORAL
  Filled 2018-10-15 (×4): qty 2

## 2018-10-15 MED ORDER — KETOROLAC TROMETHAMINE 30 MG/ML IJ SOLN
30.0000 mg | Freq: Four times a day (QID) | INTRAMUSCULAR | Status: AC
  Administered 2018-10-15 (×3): 30 mg via INTRAVENOUS
  Filled 2018-10-15 (×3): qty 1

## 2018-10-15 MED ORDER — IBUPROFEN 800 MG PO TABS
800.0000 mg | ORAL_TABLET | Freq: Four times a day (QID) | ORAL | Status: DC
  Administered 2018-10-15 – 2018-10-17 (×5): 800 mg via ORAL
  Filled 2018-10-15 (×6): qty 1

## 2018-10-15 MED ORDER — OXYCODONE-ACETAMINOPHEN 5-325 MG PO TABS
1.0000 | ORAL_TABLET | ORAL | Status: DC | PRN
  Administered 2018-10-15 (×2): 1 via ORAL
  Administered 2018-10-16 – 2018-10-17 (×7): 2 via ORAL
  Administered 2018-10-17: 1 via ORAL
  Administered 2018-10-18 (×3): 2 via ORAL
  Filled 2018-10-15 (×4): qty 2
  Filled 2018-10-15: qty 1
  Filled 2018-10-15 (×5): qty 2
  Filled 2018-10-15: qty 1
  Filled 2018-10-15 (×2): qty 2

## 2018-10-15 MED ORDER — LACTATED RINGERS IV SOLN: INTRAVENOUS | Status: DC

## 2018-10-15 MED ORDER — SIMETHICONE 80 MG PO CHEW
80.0000 mg | CHEWABLE_TABLET | Freq: Four times a day (QID) | ORAL | Status: DC
  Administered 2018-10-15 – 2018-10-18 (×13): 80 mg via ORAL
  Filled 2018-10-15 (×15): qty 1

## 2018-10-15 MED ORDER — OXYTOCIN 40 UNITS IN NORMAL SALINE INFUSION - SIMPLE MED
2.5000 [IU]/h | INTRAVENOUS | Status: AC
  Administered 2018-10-15 (×2): 2.5 [IU]/h via INTRAVENOUS
  Filled 2018-10-15: qty 1000

## 2018-10-15 MED ORDER — ZOLPIDEM TARTRATE 5 MG PO TABS: 5.0000 mg | ORAL_TABLET | Freq: Every evening | ORAL | Status: DC | PRN

## 2018-10-15 MED ORDER — PRENATAL MULTIVITAMIN CH
1.0000 | ORAL_TABLET | Freq: Every day | ORAL | Status: DC
  Administered 2018-10-15 – 2018-10-18 (×4): 1 via ORAL
  Filled 2018-10-15 (×4): qty 1

## 2018-10-15 MED ORDER — DIPHENHYDRAMINE HCL 25 MG PO CAPS: 25.0000 mg | ORAL_CAPSULE | Freq: Four times a day (QID) | ORAL | Status: DC | PRN

## 2018-10-15 MED ORDER — BREAST MILK/FORMULA (FOR LABEL PRINTING ONLY)
ORAL | Status: DC
  Filled 2018-10-15 (×30): qty 1

## 2018-10-15 MED ORDER — IBUPROFEN 800 MG PO TABS: 800.0000 mg | ORAL_TABLET | Freq: Four times a day (QID) | ORAL | Status: DC

## 2018-10-15 NOTE — Progress Notes (Signed)
Patient ID: Tasha Rodriguez, female   DOB: Dec 13, 1980, 38 y.o.   MRN: [id number]    Progress Note - Cesarean Delivery  Tasha Rodriguez is a 38 y.o. [phone] now PP day 1 s/p C-Section, Low Transverse .   Subjective:  Patient reports no problems with eating, bowel movements, voiding, or their wound  Pt pumping but plans to breastfeed  Pain controlled  Objective:  Vital signs in last 24 hours: Temp:  [97.6 F (36.4 C)-98.2 F (36.8 C)] 98.2 F (36.8 C) (07/06 1349) Pulse Rate:  [60-91] 67 (07/06 1349) Resp:  [9-19] 16 (07/06 1349) BP: (103-130)/(50-74) 114/61 (07/06 1349) SpO2:  [95 %-100 %] 99 % (07/06 1349) Weight:  [112.9 kg] 112.9 kg (07/05 1911)  Physical Exam:  General: alert, cooperative and no distress Lochia: appropriate Uterine Fundus: firm Incision: dressing large and intact, no drainage DVT Evaluation: No evidence of DVT seen on physical exam.    Data Review Recent Labs    10/14/18 2117  HGB 11.3*  HCT 33.7*    Assessment:  Active Problems:   Decreased fetal movement affecting management of mother, antepartum   Non-reassuring fetal heart rate or rhythm affecting management of mother   Non-reassuring electronic fetal monitoring tracing   Status post Cesarean section. Doing well postoperatively.   Urine output inproved - PO encouraged  Plan:       Continue current care.  Pt may shower  Remove IV  PO pain meds  Finis Bud, M.D. 10/15/2018 3:25 PM

## 2018-10-15 NOTE — Anesthesia Postprocedure Evaluation (Signed)
Anesthesia Post Note  Patient: Tasha Rodriguez  Procedure(s) Performed: CESAREAN SECTION (N/A )  Patient location during evaluation: Mother Baby Anesthesia Type: Spinal Level of consciousness: awake and alert and oriented Pain management: satisfactory to patient Vital Signs Assessment: post-procedure vital signs reviewed and stable Respiratory status: respiratory function stable Cardiovascular status: stable Postop Assessment: no backache, no headache, spinal receding, no apparent nausea or vomiting, patient able to bend at knees, adequate PO intake and able to ambulate Anesthetic complications: no     Last Vitals:  Vitals:   10/15/18 0609 10/15/18 0656  BP: 103/60   Pulse: 70   Resp: 16 16  Temp:    SpO2: 100%     Last Pain:  Vitals:   10/15/18 0545  TempSrc:   PainSc: 0-No pain                 Blima Singer

## 2018-10-15 NOTE — Progress Notes (Signed)
Pt transported in stable condition to SCN to visit infant. Vital signs WNL. Will continue to monitor and assess. FOB at bedside.

## 2018-10-15 NOTE — Lactation Note (Addendum)
This note was copied from a baby's chart. Lactation Consultation Note  Patient Name: Tasha Rodriguez [id number] Date: 10/15/2018 Reason for consult: Initial assessment;NICU baby;Early term 37-38.6wks;Infant < 6lbs;Other (Comment)(Profoundly inverted nipple/areola)  Assisted mom with pumping bilaterally with Symphony pump.  #30 flanges work the best.  This is only the third time she has pumped since delivering.  She has been with her baby in SCN for long periods and has done skin to skin.  Mom has profoundly large inverted nipples and areolas.  It appears that the mammary pit failed to elevate causing deep indentation involving the nipple and areola.  There is no eversion with compression or pumping.  Mom reports trying to breast feed with other 2 without success.  I do not believe that she would even benefit from #24 nipple shield but gave to mom upon request even though she voices that she doubts she will even put this baby to the breast.  Mom is willing to pump to supply breast milk for her as long as she can.  She pumped with her first baby for 2 months and with her last baby for 6 months and reports having a good milk supply.  Mom does not have a bra on right now.  Breast shells given to try once she put bra on.  The center of the shells for flat and inverted nipples is too small so the shells for sore nipples was given.  She has been unable to express any milk so far either by hand expression or pumping.  Coconut oil given  to help get better seal and for discomfort.  Reviewed supply and demand and need to pump 8 or more times in 24 hours to bring in mature milk, ensure a plentiful milk supply and prevent engorgement.  Reviewed massage, hand expression, pumping, cleaning, collection, storage, labeling and handling of breast milk.  Praised mom for her commitment to pump to supply milk for her baby in SCN.  Mom and baby's information faxed to [facility] and spoke with Romie Minus at [facility].  There are no loaner Symphony  pumps at present available at [facility].  Reminded mom to make sure she gets with lactation consultant tomorrow before discharge to get a DEBP through [facility] if there are still no available pumps through [facility].  Lactation name and number written on white board and encouraged to call with any questions, concerns or assistance.      Maternal Data Formula Feeding for Exclusion: No Has patient been taught Hand Expression?: Yes Does the patient have breastfeeding experience prior to this delivery?: Yes  Feeding Feeding Type: Formula  LATCH Score                   Interventions Interventions: Breast feeding basics reviewed;Reverse pressure;Breast compression;Expressed milk;Coconut oil;DEBP  Lactation Tools Discussed/Used Tools: Shells;Pump;Flanges;Coconut oil;Nipple Shields Nipple shield size: 24;Other (comment)(Not sure if 24 will work d/t large inverted nipple/areola) Flange Size: 30 Shell Type: Sore;Other (comment)(Shells for inverted nipple too small so gave shells for sore) WIC Program: Yes Pump Review: Setup, frequency, and cleaning;Milk Storage;Other (comment) Initiated by:: Night nurse Date initiated:: 10/15/18   Consult Status Consult Status: Follow-up Date: (Mom will need DEBP at discharge for home use while separated) Follow-up type: Call as needed    Jarold Motto 10/15/2018, 5:39 PM

## 2018-10-16 LAB — SURGICAL PATHOLOGY

## 2018-10-16 LAB — GLUCOSE, CAPILLARY: Glucose-Capillary: 96 mg/dL (ref 70–99)

## 2018-10-16 NOTE — Discharge Instructions (Signed)
Breast Pumping Tips °Breast pumping is a way to get milk out of your breasts. You will then store the milk for your baby to use when you are away from home. There are three ways to pump. You can: °· Use your hand to massage and squeeze your breast (hand expression). °· Use a hand-held machine to manually pump your milk. °· Use an electric machine to pump your milk. °In the beginning you may not get much milk. After a few days your breasts should make more. Pumping can help you start making milk after your baby is born. Pumping helps you to keep making milk when you are away from your baby. °When should I pump? °You can start pumping soon after your baby is born. Follow these tips: °· When you are with your baby: °? Pump after you breastfeed. °? Pump from the free breast while you breastfeed. °· When you are away from your baby: °? Pump every 2-3 hours for 15 minutes. °? Pump both breasts at the same time if you can. °· If your baby drinks formula, pump around the time your baby gets the formula. °· If you drank alcohol, wait 2 hours before you pump. °· If you are going to have surgery, ask your doctor when you should pump again. °How do I get ready to pump? °Take steps to relax. Try these things to help your milk come in: °· Smell your baby's blanket or clothes. °· Look at a picture or video of your baby. °· Sit in a quiet, private space. °· Massage your breast and nipple. °· Place a cloth on your breast. The cloth should be warm and a little wet. °· Play relaxing music. °· Picture your milk flowing. °What are some tips? °General tips for pumping breast milk ° °· Always wash your hands before pumping. °· If you do not get much milk or if pumping hurts, try different pump settings or a different kind of pump. °· Drink enough fluid so your pee (urine) is clear or pale yellow. °· Wear clothing that opens in the front or is easy to take off. °· Pump milk into a clean bottle or container. °· Do not use anything that has  nicotine or tobacco. Examples are cigarettes and e-cigarettes. If you need help quitting, ask your doctor. °Tips for storing breast milk ° °· Store breast milk in a clean, BPA-free container. These include: °? A glass or plastic bottle. °? A milk storage bag. °· Store only 2-4 ounces of breast milk in each container. °· Swirl the breast milk in the container. Do not shake it. °· Write down the date you pumped the milk on the container. °· This is how long you can store breast milk: °? Room temperature: 6-8 hours. It is best to use the milk within 4 hours. °? Cooler with ice packs: 24 hours. °? Refrigerator: 5-8 days, if the milk is clean. It is best to use the milk within 3 days. °? Freezer: 9-12 months, if the milk is clean and stored away from the freezer door. It is best to use the milk within 6 months. °· Put milk in the back of the refrigerator or freezer. °· Thaw frozen milk using warm water. Do not use the microwave. °Tips for choosing a breast pump °When choosing a pump, keep the following things in mind: °· Manual breast pumps do not need electricity. They cost less. They can be hard to use. °· Electric breast pumps use electricity. They   are more expensive. They are easier to use. They collect more milk.  The suction cup (flange) should be the right size.  Before you buy the pump, check if your insurance will pay for it. Tips for caring for a breast pump  Check the manual that came with your pump for cleaning tips.  Clean the pump after you use it. To do this: 1. Wipe down the electrical part. Use a dry cloth or paper towel. Do not put this part in water or in cleaning products. 2. Wash the plastic parts with soap and warm water. Or use the dishwasher if the manual says it is safe. You do not need to clean the tubing unless it touched breast milk. 3. Let all the parts air dry. Avoid drying them with a cloth or towel. 4. When the parts are clean and dry, put the pump back together. Then store  the pump.  If there is water in the tubing when you want to pump: 1. Attach the tubing to the pump. 2. Turn on the pump. 3. Turn off the pump when the tube is dry.  Try not to touch the inside of pump parts. Summary  Pumping can help you start making milk after your baby is born. It lets you keep making milk when you are away from your baby.  When you are away from your baby, pump for about 15 minutes every 2-3 hours. Pump both breasts at the same time, if you can. This information is not intended to replace advice given to you by your health care provider. Make sure you discuss any questions you have with your health care provider. Document Released: 09/14/2007 Document Revised: 07/18/2018 Document Reviewed: 05/02/2016 Elsevier Patient Education  2020 [employer]. Postpartum Care After Cesarean Delivery This sheet gives you information about how to care for yourself from the time you deliver your baby to up to 6-12 weeks after delivery (postpartum period). Your health care provider may also give you more specific instructions. If you have problems or questions, contact your health care provider. Follow these instructions at home: Medicines  Take over-the-counter and prescription medicines only as told by your health care provider.  If you were prescribed an antibiotic medicine, take it as told by your health care provider. Do not stop taking the antibiotic even if you start to feel better.  Ask your health care provider if the medicine prescribed to you: ? Requires you to avoid driving or using heavy machinery. ? Can cause constipation. You may need to take actions to prevent or treat constipation, such as:  Drink enough fluid to keep your urine pale yellow.  Take over-the-counter or prescription medicines.  Eat foods that are high in fiber, such as beans, whole grains, and fresh fruits and vegetables.  Limit foods that are high in fat and processed sugars, such as fried or  sweet foods. Activity  Gradually return to your normal activities as told by your health care provider.  Avoid activities that take a lot of effort and energy (are strenuous) until approved by your health care provider. Walking at a slow to moderate pace is usually safe. Ask your health care provider what activities are safe for you. ? Do not lift anything that is heavier than your baby or 10 lb (4.5 kg) as told by your health care provider. ? Do not vacuum, climb stairs, or drive a car for as long as told by your health care provider.  If possible, have someone help  you at home until you are able to do your usual activities yourself.  Rest as much as possible. Try to rest or take naps while your baby is sleeping. Vaginal bleeding  It is normal to have vaginal bleeding (lochia) after delivery. Wear a sanitary pad to absorb vaginal bleeding and discharge. ? During the first week after delivery, the amount and appearance of lochia is often similar to a menstrual period. ? Over the next few weeks, it will gradually decrease to a dry, yellow-brown discharge. ? For most women, lochia stops completely by 4-6 weeks after delivery. Vaginal bleeding can vary from woman to woman.  Change your sanitary pads frequently. Watch for any changes in your flow, such as: ? A sudden increase in volume. ? A change in color. ? Large blood clots.  If you pass a blood clot, save it and call your health care provider to discuss. Do not flush blood clots down the toilet before you get instructions from your health care provider.  Do not use tampons or douches until your health care provider says this is safe.  If you are not breastfeeding, your period should return 6-8 weeks after delivery. If you are breastfeeding, your period may return anytime between 8 weeks after delivery and the time that you stop breastfeeding. Perineal care   If your C-section (Cesarean section) was unplanned, and you were allowed to  labor and push before delivery, you may have pain, swelling, and discomfort of the tissue between your vaginal opening and your anus (perineum). You may also have an incision in the tissue (episiotomy) or the tissue may have torn during delivery. Follow these instructions as told by your health care provider: ? Keep your perineum clean and dry as told by your health care provider. Use medicated pads and pain-relieving sprays and creams as directed. ? If you have an episiotomy or vaginal tear, check the area every day for signs of infection. Check for:  Redness, swelling, or pain.  Fluid or blood.  Warmth.  Pus or a bad smell. ? You may be given a squirt bottle to use instead of wiping to clean the perineum area after you go to the bathroom. As you start healing, you may use the squirt bottle before wiping yourself. Make sure to wipe gently. ? To relieve pain caused by an episiotomy, vaginal tear, or hemorrhoids, try taking a warm sitz bath 2-3 times a day. A sitz bath is a warm water bath that is taken while you are sitting down. The water should only come up to your hips and should cover your buttocks. Breast care  Within the first few days after delivery, your breasts may feel heavy, full, and uncomfortable (breast engorgement). You may also have milk leaking from your breasts. Your health care provider can suggest ways to help relieve breast discomfort. Breast engorgement should go away within a few days.  If you are breastfeeding: ? Wear a bra that supports your breasts and fits you well. ? Keep your nipples clean and dry. Apply creams and ointments as told by your health care provider. ? You may need to use breast pads to absorb milk leakage. ? You may have uterine contractions every time you breastfeed for several weeks after delivery. Uterine contractions help your uterus return to its normal size. ? If you have any problems with breastfeeding, work with your health care provider or a  Advertising copywriterlactation consultant.  If you are not breastfeeding: ? Avoid touching your breasts as this  can make your breasts produce more milk. ? Wear a well-fitting bra and use cold packs to help with swelling. ? Do not squeeze out (express) milk. This causes you to make more milk. Intimacy and sexuality  Ask your health care provider when you can engage in sexual activity. This may depend on your: ? Risk of infection. ? Healing rate. ? Comfort and desire to engage in sexual activity.  You are able to get pregnant after delivery, even if you have not had your period. If desired, talk with your health care provider about methods of family planning or birth control (contraception). Lifestyle  Do not use any products that contain nicotine or tobacco, such as cigarettes, e-cigarettes, and chewing tobacco. If you need help quitting, ask your health care provider.  Do not drink alcohol, especially if you are breastfeeding. Eating and drinking   Drink enough fluid to keep your urine pale yellow.  Eat high-fiber foods every day. These may help prevent or relieve constipation. High-fiber foods include: ? Whole grain cereals and breads. ? Brown rice. ? Beans. ? Fresh fruits and vegetables.  Take your prenatal vitamins until your postpartum checkup or until your health care provider tells you it is okay to stop. General instructions  Keep all follow-up visits for you and your baby as told by your health care provider. Most women visit their health care provider for a postpartum checkup within the first 3-6 weeks after delivery. Contact a health care provider if you:  Feel unable to cope with the changes that a new baby brings to your life, and these feelings do not go away.  Feel unusually sad or worried.  Have breasts that are painful, hard, or turn red.  Have a fever.  Have trouble holding urine or keeping urine from leaking.  Have little or no interest in activities you used to  enjoy.  Have not breastfed at all and you have not had a menstrual period for 12 weeks after delivery.  Have stopped breastfeeding and you have not had a menstrual period for 12 weeks after you stopped breastfeeding.  Have questions about caring for yourself or your baby.  Pass a blood clot from your vagina. Get help right away if you:  Have chest pain.  Have difficulty breathing.  Have sudden, severe leg pain.  Have severe pain or cramping in your abdomen.  Bleed from your vagina so much that you fill more than one sanitary pad in one hour. Bleeding should not be heavier than your heaviest period.  Develop a severe headache.  Faint.  Have blurred vision or spots in your vision.  Have a bad-smelling vaginal discharge.  Have thoughts about hurting yourself or your baby. If you ever feel like you may hurt yourself or others, or have thoughts about taking your own life, get help right away. You can go to your nearest emergency department or call:  Your local emergency services (911 in the [place]  A suicide crisis helpline, such as the National Suicide Prevention Lifeline at 531-581-0356. This is open 24 hours a day. Summary  The period of time from when you deliver your baby to up to 6-12 weeks after delivery is called the postpartum period.  Gradually return to your normal activities as told by your health care provider.  Keep all follow-up visits for you and your baby as told by your health care provider. This information is not intended to replace advice given to you by your health care provider. Make  sure you discuss any questions you have with your health care provider. Document Released: 03/25/2000 Document Revised: 11/15/2017 Document Reviewed: 11/15/2017 Elsevier Patient Education  2020 [employer]. Postpartum Baby Blues The postpartum period begins right after the birth of a baby. During this time, there is often a lot of joy and excitement. It is also a time  of many changes in the life of the parents. No matter how many times a mother gives birth, each child brings new challenges to the family, including different ways of relating to one another. It is common to have feelings of excitement along with confusing changes in moods, emotions, and thoughts. You may feel happy one minute and sad or stressed the next. These feelings of sadness usually happen in the period right after you have your baby, and they go away within a week or two. This is called the "baby blues." What are the causes? There is no known cause of baby blues. It is likely caused by a combination of factors. However, changes in hormone levels after childbirth are believed to trigger some of the symptoms. Other factors that can play a role in these mood changes include:  Lack of sleep.  Stressful life events, such as poverty, caring for a loved one, or death of a loved one.  Genetics. What are the signs or symptoms? Symptoms of this condition include:  Brief changes in mood, such as going from extreme happiness to sadness.  Decreased concentration.  Difficulty sleeping.  Crying spells and tearfulness.  Loss of appetite.  Irritability.  Anxiety. If the symptoms of baby blues last for more than 2 weeks or become more severe, you may have postpartum depression. How is this diagnosed? This condition is diagnosed based on an evaluation of your symptoms. There are no medical or lab tests that lead to a diagnosis, but there are various questionnaires that a health care provider may use to identify women with the baby blues or postpartum depression. How is this treated? Treatment is not needed for this condition. The baby blues usually go away on their own in 1-2 weeks. Social support is often all that is needed. You will be encouraged to get adequate sleep and rest. Follow these instructions at home: Lifestyle      Get as much rest as you can. Take a nap when the baby  sleeps.  Exercise regularly as told by your health care provider. Some women find yoga and walking to be helpful.  Eat a balanced and nourishing diet. This includes plenty of fruits and vegetables, whole grains, and lean proteins.  Do little things that you enjoy. Have a cup of tea, take a bubble bath, read your favorite magazine, or listen to your favorite music.  Avoid alcohol.  Ask for help with household chores, cooking, grocery shopping, or running errands. Do not try to do everything yourself. Consider hiring a postpartum doula to help. This is a professional who specializes in providing support to new mothers.  Try not to make any major life changes during pregnancy or right after giving birth. This can add stress. General instructions  Talk to people close to you about how you are feeling. Get support from your partner, family members, friends, or other new moms. You may want to join a support group.  Find ways to cope with stress. This may include: ? Writing your thoughts and feelings in a journal. ? Spending time outside. ? Spending time with people who make you laugh.  Try to  stay positive in how you think. Think about the things you are grateful for.  Take over-the-counter and prescription medicines only as told by your health care provider.  Let your health care provider know if you have any concerns.  Keep all postpartum visits as told by your health care provider. This is important. Contact a health care provider if:  Your baby blues do not go away after 2 weeks. Get help right away if:  You have thoughts of taking your own life (suicidal thoughts).  You think you may harm the baby or other people.  You see or hear things that are not there (hallucinations). Summary  After giving birth, you may feel happy one minute and sad or stressed the next. Feelings of sadness that happen right after the baby is born and go away after a week or two are called the "baby  blues."  You can manage the baby blues by getting enough rest, eating a healthy diet, exercising, spending time with supportive people, and finding ways to cope with stress.  If feelings of sadness and stress last longer than 2 weeks or get in the way of caring for your baby, talk to your health care provider. This may mean you have postpartum depression. This information is not intended to replace advice given to you by your health care provider. Make sure you discuss any questions you have with your health care provider. Document Released: 12/31/2003 Document Revised: 07/20/2018 Document Reviewed: 05/24/2016 Elsevier Patient Education  2020 [employer]

## 2018-10-16 NOTE — Plan of Care (Signed)
Vs stable; up ad lib; tolerating regular diet; taking motrin and percocet for pain control; using electric breast pump; goes to SCN to see/hold infant; pt ambulates well in the room; RN encouraged pt to ambulate in hallway; RN suggested the next trip to the SCN try walking in to SCN then wheelchair back to room OR wheelchair in to [facility] then ambulate back to room; RN also discussed the goal is to be able to ambulate in to SCN and back to room

## 2018-10-16 NOTE — Progress Notes (Addendum)
Patient ID: Tasha Rodriguez, female   DOB: January 26, 1981, 38 y.o.   MRN: [id number]  Post Partum Day # 2, s/p repeat cesarean section, history of gestational diabetes  Subjective:  Patient resting in bed with eyes closed. No visible signs of distress.   Objective:  Temp:  [98.1 F (36.7 C)-98.5 F (36.9 C)] 98.5 F (36.9 C) (07/07 1506) Pulse Rate:  [75-82] 80 (07/07 1506) Resp:  [18-20] 20 (07/07 1506) BP: (101-121)/(56-84) 101/56 (07/07 1506) SpO2:  [96 %-100 %] 98 % (07/07 0752)  Physical Exam: Deferred, patient sleeping. Please see nursing notes.   Recent Labs    10/14/18 2117  HGB 11.3*  HCT 33.7*    Assessment:  38 year old G4P3 Post Partum Day # 2, s/p repeat cesarean section, history of gestational diabetes, Rh positive  Pumping breast milk-Infant in special care nursery  Plan:  May discontinue blood glucose monitoring.   CBC and Vitamin D level in the morning, see orders.   Plan for discharge tomorrow.    LOS: 2 days   Diona Fanti, CNM [facility], [facility] 10/16/2018 5:17 PM

## 2018-10-17 ENCOUNTER — Inpatient Hospital Stay
Admission: RE | Admit: 2018-10-17 | Discharge: 2018-10-17 | Disposition: A | Discharge status: Home / Self Care | Payer: Self-pay | Source: Ambulatory Visit

## 2018-10-17 LAB — CBC
HCT: 30.7 % — ABNORMAL LOW (ref 36.0–46.0)
Hemoglobin: 10.1 g/dL — ABNORMAL LOW (ref 12.0–15.0)
MCH: 30.1 pg (ref 26.0–34.0)
MCHC: 32.9 g/dL (ref 30.0–36.0)
MCV: 91.4 fL (ref 80.0–100.0)
Platelets: 147 10*3/uL — ABNORMAL LOW (ref 150–400)
RBC: 3.36 MIL/uL — ABNORMAL LOW (ref 3.87–5.11)
RDW: 13.9 % (ref 11.5–15.5)
WBC: 9.2 10*3/uL (ref 4.0–10.5)
nRBC: 0 % (ref 0.0–0.2)

## 2018-10-17 MED ORDER — IBUPROFEN 800 MG PO TABS
800.0000 mg | ORAL_TABLET | Freq: Four times a day (QID) | ORAL | Status: DC
  Administered 2018-10-17 (×3): 800 mg via ORAL
  Filled 2018-10-17 (×3): qty 1

## 2018-10-17 MED ORDER — IBUPROFEN 800 MG PO TABS
800.0000 mg | ORAL_TABLET | Freq: Four times a day (QID) | ORAL | Status: DC
  Administered 2018-10-18 (×4): 800 mg via ORAL
  Filled 2018-10-17 (×4): qty 1

## 2018-10-17 NOTE — TOC Initial Note (Signed)
Transition of Care [facility]) - Initial/Assessment Note    Patient Details  Name: Tasha Rodriguez MRN: [id number] Date of Birth: 01-06-1981  [facility] CM/SW Contact:    Beverly Sessions, RN Phone Number: 10/17/2018, 1:47 PM  Clinical Narrative:                    Consult due to 11 on Edinburgh score.  RNCM attempted to meet with patient however she is currently off the floor in the SCN.  Will follow up     Patient Goals and CMS Choice        Expected Discharge Plan and Services                                                Prior Living Arrangements/Services                       Activities of Daily Living Home Assistive Devices/Equipment: None ADL Screening (condition at time of admission) Patient's cognitive ability adequate to safely complete daily activities?: Yes Is the patient deaf or have difficulty hearing?: No Does the patient have difficulty seeing, even when wearing glasses/contacts?: No Does the patient have difficulty concentrating, remembering, or making decisions?: No Patient able to express need for assistance with ADLs?: Yes Does the patient have difficulty dressing or bathing?: No Independently performs ADLs?: Yes (appropriate for developmental age) Does the patient have difficulty walking or climbing stairs?: No Weakness of Legs: None Weakness of Arms/Hands: None  Permission Sought/Granted                  Emotional Assessment              Admission diagnosis:  37 wks Patient Active Problem List   Diagnosis Date Noted  . Decreased fetal movement affecting management of mother, antepartum 10/14/2018  . Non-reassuring fetal heart rate or rhythm affecting management of mother 10/14/2018  . Non-reassuring electronic fetal monitoring tracing 10/14/2018  . Indication for care in labor and delivery, antepartum 10/10/2018  . Obesity in pregnancy 09/11/2018  . Previous cesarean section 07/23/2018  .  Maternal varicella, non-immune 07/23/2018  . Antepartum multigravida of advanced maternal age 60/13/2020  . Gestational diabetes 06/27/2018  . BMI 33.0-33.9,adult 06/27/2018  . First trimester bleeding 12/05/2016   PCP:  Patient, No Pcp Per Pharmacy:   [id number] [address], [place] [address] Phone: [phone] Fax: [phone]     Social Determinants of Health (SDOH) Interventions    Readmission Risk Interventions No flowsheet data found.

## 2018-10-17 NOTE — Pre-Procedure Instructions (Signed)
Patient delivered

## 2018-10-17 NOTE — Progress Notes (Signed)
Progress Note - Cesarean Delivery  Tasha Rodriguez is a 38 y.o. [phone] now PP day 3 s/p C-Section, Low Transverse .   Subjective:  Patient reports no problems with eating, bowel movements, voiding, or their wound   Objective:  Vital signs in last 24 hours: Temp:  [98.3 F (36.8 C)-98.6 F (37 C)] 98.6 F (37 C) (07/08 0238) Pulse Rate:  [72-80] 72 (07/08 0238) Resp:  [18-20] 18 (07/08 0238) BP: (101-121)/(52-57) 102/52 (07/08 0238) SpO2:  [98 %-99 %] 99 % (07/08 0238)  Physical Exam:  General: alert, cooperative, appears stated age and no distress Lochia: appropriate Uterine Fundus: firm Incision: healing well, no significant drainage, no dehiscence, no significant erythema DVT Evaluation: No evidence of DVT seen on physical exam. No cords or calf tenderness. No significant calf/ankle edema.    Data Review Recent Labs    10/14/18 2117 10/17/18 0634  HGB 11.3* 10.1*  HCT 33.7* 30.7*    Assessment:  Active Problems:   Decreased fetal movement affecting management of mother, antepartum   Non-reassuring fetal heart rate or rhythm affecting management of mother   Non-reassuring electronic fetal monitoring tracing   Status post Cesarean section. Doing well postoperatively.     Plan:       Continue current care.  Baby remains in nursery for glucose control, pt would like to stay today for infant indication/nursing. She will be d/c home tomorrow.  Philip Aspen, CNM  10/17/2018 7:50 AM

## 2018-10-17 NOTE — Plan of Care (Signed)
Vs stable; up ad lib; tolerating regular diet; using electric breast pump; ambulating well to and from SCN; goes to SCN to see/hold baby; taking motrin and percocet for pain control

## 2018-10-17 NOTE — Clinical Social Work Maternal (Signed)
Patient POD 1 from csection RNCM met with mom in SCN while visiting baby. RNCM consult for Edinburgh score.   Patient states that she lives at home with her boyfriend (the childs father), and her 2 older children ages 57 and 29.   Patient states that she does not have a PCP, and does not wish to obtain one at this time.  Patient states she will have follow up at [facility]. Patient denies any issues with transportation   Patient states that she has all necessities needed for baby including diapers, crib, clothing, and car seat.    Patient is very positive during our discussion, and is ready to take baby home when she is ready.  Per nursing staff patient has been caring for the baby appropriately.

## 2018-10-18 LAB — VITAMIN D 25 HYDROXY (VIT D DEFICIENCY, FRACTURES): Vit D, 25-Hydroxy: 30.4 ng/mL (ref 30.0–100.0)

## 2018-10-18 MED ORDER — OXYCODONE-ACETAMINOPHEN 5-325 MG PO TABS: 1.0000 | ORAL_TABLET | ORAL | 0 refills | Status: DC | PRN

## 2018-10-18 MED ORDER — IBUPROFEN 800 MG PO TABS: 800.0000 mg | ORAL_TABLET | Freq: Four times a day (QID) | ORAL | 0 refills | Status: DC

## 2018-10-18 NOTE — Discharge Summary (Signed)
Pt dc and follow up appt reviewed. Pt verbalizes understanding of all dc instructions. Prescriptions already received. dc'd via wc to home by NT

## 2018-10-18 NOTE — Discharge Summary (Signed)
Obstetric Discharge Summary  Patient ID: Amyre Segundo MRN: [id number] DOB/AGE: 04/16/1980 38 y.o.   Date of Admission: 10/14/2018  Date of Discharge: 10/18/2018  Admitting Diagnosis: Decreased Fetal Movement, non-reassuring fetal heart rate, previous cesarean delivery x2, gestational diabetes at [redacted]w[redacted]d Mode of Delivery: repeat cesarean section       low uterine, transverse     Discharge Diagnosis: Reasons for cesarean section  Other  non reassuring fetal heart rate and Prior uterine surgery previous c section x 2   Intrapartum Procedures: spinal   Post partum procedures: none  Complications: none                        Discharge Day SOAP Note:  Subjective:  The patient has no complaints.  She is ambulating well. She is taking PO well. Pain is well controlled with current medications. Patient is urinating without difficulty.   She is passing flatus and has had bowel movement.    Objective  Vital signs in last 24 hours: BP 108/60 (BP Location: Left Arm)   Pulse 71   Temp 98.6 F (37 C) (Oral)   Resp 18   Ht '5\' 10"'$  (1.778 m)   Wt 112.9 kg   LMP 01/23/2018 (Approximate)   SpO2 100% Comment: Room Air  Breastfeeding Unknown   BMI 35.73 kg/m   Physical Exam: Gen: NAD Abdomen:  clean, dry, no drainage Fundus Fundal Tone: Firm  Lochia Amount: Small     Data Review Labs: CBC Latest Ref Rng & Units 10/17/2018 10/14/2018 04/18/2018  WBC 4.0 - 10.5 K/uL 9.2 11.3(H) 10.7  Hemoglobin 12.0 - 15.0 g/dL 10.1(L) 11.3(L) 12.8  Hematocrit 36.0 - 46.0 % 30.7(L) 33.7(L) 38.7  Platelets 150 - 400 K/uL 147(L) 181 -   B POS  Assessment:  Active Problems:   Decreased fetal movement affecting management of mother, antepartum   Non-reassuring fetal heart rate or rhythm affecting management of mother   Non-reassuring electronic fetal monitoring tracing   Doing well.  Normal progress as expected.    Plan:  Discharge to home  Modified rest as directed - may slowly resume  normal activities with restrictions  as discussed.  Medications as written.  See below for additional.       Discharge Instructions: Per After Visit Summary. Follow up 1 wk with Midwife for wound check.  Activity: Advance as tolerated. Pelvic rest for 6 weeks.  Also refer to After Visit Summary.  Wound care discussed. Diet: Regular Medications: Allergies as of 10/18/2018   No Known Allergies     Medication List    STOP taking these medications   Accu-Chek FastClix Lancets Misc   blood glucose meter kit and supplies   glucose blood test strip Commonly known as: Accu-Chek Aviva   glyBURIDE 2.5 MG tablet Commonly known as: DIABETA     TAKE these medications   cefdinir 300 MG capsule Commonly known as: OMNICEF Take 1 capsule (300 mg total) by mouth 2 (two) times daily.   cholecalciferol 1000 units tablet Commonly known as: VITAMIN D Take 1,000 Units by mouth daily.   ibuprofen 800 MG tablet Commonly known as: ADVIL Take 1 tablet (800 mg total) by mouth every 6 (six) hours.   oxyCODONE-acetaminophen 5-325 MG tablet Commonly known as: PERCOCET/ROXICET Take 1-2 tablets by mouth every 4 (four) hours as needed for moderate pain.   prenatal multivitamin Tabs tablet Take 1 tablet by mouth daily at 12 noon.  Outpatient follow up: one week with midwife for incision check , then: Follow-up Information    [facility]. Schedule an appointment as soon as possible for a visit in 6 week(s).   Why: Please call to schedule six (6) week postpartum visit with midwife Contact information: [address] [phone]       Diona Fanti, CNM. Call in 1 week(s).   Specialties: Certified Nurse Midwife, Obstetrics and Gynecology, Radiology Why: Please call to schedule one (1) week incision check with [facility] Contact information: [address] [place] [address] [phone]           Postpartum contraception: Was planning tubal- it was not done. Will discuss PP visit.  Discharged Condition: good  Discharged to: home  Newborn Data: [facility], unsure of discharge   Apgars: APGAR (1 MIN): 7   APGAR (5 MINS): 8   APGAR (10 MINS):    Baby Feeding: Breast  Philip Aspen, CNM  10/18/2018 9:18 AM

## 2018-10-18 NOTE — Lactation Note (Signed)
This note was copied from a baby's chart. Lactation Consultation Note  Patient Name: Tasha Rodriguez MOQHU'T Date: 10/18/2018 Reason for consult: Follow-up assessment   Maternal Data Has patient been taught Hand Expression?: Yes  Feeding Feeding Type: Breast Fed Nipple Type: Slow - flow  LATCH Score Latch: Repeated attempts needed to sustain latch, nipple held in mouth throughout feeding, stimulation needed to elicit sucking reflex.  Audible Swallowing: A few with stimulation  Type of Nipple: Inverted  Comfort (Breast/Nipple): Engorged, cracked, bleeding, large blisters, severe discomfort  Hold (Positioning): Assistance needed to correctly position infant at breast and maintain latch.  LATCH Score: 3  Interventions Interventions: Breast feeding basics reviewed;Assisted with latch;Breast massage Also mother is engorged Ice filled gloves used and cbbage leaf ordered. cussed/Used Tools: Supplemental Nutrition System;74F feeding tube / Syringe;Pump;Nipple Shields Nipple shield size: Other (comment) Flange Size: 36 Shell Type: Inverted Breast pump type: Double-Electric Breast Pump Pump Review: Setup, frequency, and cleaning;Milk Storage   Consult Status Consult Status: PRN Follow-up type: In-patient    Daryel November 10/18/2018, 1:29 PM

## 2018-10-18 NOTE — Final Progress Note (Signed)
Discharge Day SOAP Note:  Subjective:  The patient has no complaints.  She is ambulating well. She is taking PO well. Pain is well controlled with current medications. Patient is urinating without difficulty.   She is passing flatus and has had bowel movement.    Objective  Vital signs in last 24 hours: BP 108/60 (BP Location: Left Arm)   Pulse 71   Temp 98.6 F (37 C) (Oral)   Resp 18   Ht '5\' 10"'$  (1.778 m)   Wt 112.9 kg   LMP 01/23/2018 (Approximate)   SpO2 100% Comment: Room Air  Breastfeeding Unknown   BMI 35.73 kg/m   Physical Exam: Gen: NAD Abdomen:  clean, dry, no drainage Fundus Fundal Tone: Firm  Lochia Amount: Small     Data Review Labs: CBC Latest Ref Rng & Units 10/17/2018 10/14/2018 04/18/2018  WBC 4.0 - 10.5 K/uL 9.2 11.3(H) 10.7  Hemoglobin 12.0 - 15.0 g/dL 10.1(L) 11.3(L) 12.8  Hematocrit 36.0 - 46.0 % 30.7(L) 33.7(L) 38.7  Platelets 150 - 400 K/uL 147(L) 181 -   B POS  Assessment:  Active Problems:   Decreased fetal movement affecting management of mother, antepartum   Non-reassuring fetal heart rate or rhythm affecting management of mother   Non-reassuring electronic fetal monitoring tracing   Doing well.  Normal progress as expected.    Plan:  Discharge to home  Modified rest as directed - may slowly resume normal activities with restrictions  as discussed.  Medications as written.  See below for additional.       Discharge Instructions: Per After Visit Summary. Follow up 1 wk with Midwife for wound check.  Activity: Advance as tolerated. Pelvic rest for 6 weeks.  Also refer to After Visit Summary.  Wound care discussed. Diet: Regular Medications: Allergies as of 10/18/2018   No Known Allergies     Medication List    STOP taking these medications   Accu-Chek FastClix Lancets Misc   blood glucose meter kit and supplies   glucose blood test strip Commonly known as: Accu-Chek Aviva   glyBURIDE 2.5 MG tablet Commonly known  as: DIABETA     TAKE these medications   cefdinir 300 MG capsule Commonly known as: OMNICEF Take 1 capsule (300 mg total) by mouth 2 (two) times daily.   cholecalciferol 1000 units tablet Commonly known as: VITAMIN D Take 1,000 Units by mouth daily.   ibuprofen 800 MG tablet Commonly known as: ADVIL Take 1 tablet (800 mg total) by mouth every 6 (six) hours.   oxyCODONE-acetaminophen 5-325 MG tablet Commonly known as: PERCOCET/ROXICET Take 1-2 tablets by mouth every 4 (four) hours as needed for moderate pain.   prenatal multivitamin Tabs tablet Take 1 tablet by mouth daily at 12 noon.      Outpatient follow up: one week with midwife for incision check , then: Follow-up Information    [facility]. Schedule an appointment as soon as possible for a visit in 6 week(s).   Why: Please call to schedule six (6) week postpartum visit with midwife Contact information: [address]  [address] [phone]       Diona Fanti, CNM. Call in 1 week(s).   Specialties: Certified Nurse Midwife, Obstetrics and Gynecology, Radiology Why: Please call to schedule one (1) week incision check with [facility] Contact information: [address] [place] [address] [phone]          Postpartum contraception: Was planning tubal-  it was not done. Will discuss PP visit.  Discharged Condition: good  Discharged to: home  Newborn Data: [facility] nursery, unsure of discharge   Apgars: APGAR (1 MIN): 7   APGAR (5 MINS): 8   APGAR (10 MINS):    Baby Feeding: Breast  Philip Aspen, CNM  10/18/2018 9:18 AM

## 2018-10-19 ENCOUNTER — Ambulatory Visit: Payer: Self-pay

## 2018-10-19 NOTE — Lactation Note (Signed)
This note was copied from a baby's chart. Lactation Consultation Note  Patient Name: Tasha Rodriguez [id number] Date: 10/19/2018   Mom engorged.  Set up Symphony DEBP and #30 flange at baby's bedside in SCN.  Assisted mom with hand expression, breast massage and pumping.  Encouraged mom to pump 8 or more times in 24 hours to drain breasts, ensure a plentiful milk supply and prevent engorgement.  Encouraged mom to call [facility] with any concerns, questions or assistance.  Maternal Data    Feeding Feeding Type: Breast Milk Nipple Type: Slow - flow  LATCH Score                   Interventions    Lactation Tools Discussed/Used     Consult Status      Jarold Motto 10/19/2018, 7:57 PM

## 2018-10-22 ENCOUNTER — Other Ambulatory Visit: Payer: Self-pay

## 2018-10-22 ENCOUNTER — Ambulatory Visit (INDEPENDENT_AMBULATORY_CARE_PROVIDER_SITE_OTHER): Payer: Medicaid Other | Admitting: Certified Nurse Midwife

## 2018-10-22 ENCOUNTER — Encounter: Admission: RE | Payer: Self-pay | Source: Home / Self Care

## 2018-10-22 ENCOUNTER — Encounter: Payer: Self-pay | Admitting: Certified Nurse Midwife

## 2018-10-22 ENCOUNTER — Ambulatory Visit: Payer: Self-pay

## 2018-10-22 ENCOUNTER — Inpatient Hospital Stay
Admission: RE | Admit: 2018-10-22 | Payer: Medicaid Other | Source: Home / Self Care | Admitting: Obstetrics and Gynecology

## 2018-10-22 VITALS — BP 123/89 | HR 77 | Ht 70.0 in | Wt 247.2 lb

## 2018-10-22 DIAGNOSIS — Z98891 History of uterine scar from previous surgery: Secondary | ICD-10-CM

## 2018-10-22 DIAGNOSIS — Z5189 Encounter for other specified aftercare: Secondary | ICD-10-CM

## 2018-10-22 SURGERY — Surgical Case
Anesthesia: Spinal

## 2018-10-22 NOTE — Progress Notes (Signed)
    OBSTETRICS/GYNECOLOGY POST-OPERATIVE CLINIC VISIT  Subjective:     Tasha Rodriguez is a 38 y.o. female who presents to the clinic 1 week status post repeat cesarean section for non-reassuring fetal heart rate, history of two (2) previous cesarean section-declines trial of labor, and GDMA2.   Eating a regular diet without difficulty. Bowel movements are normal. Pain is controlled with current analgesics. Medications being used: ibuprofen (OTC) and narcotic analgesics including Percocet.   Infant is in special care nursery due to low birth weight. Currently, pumping without difficulty.   No longer desires bilateral tubal ligation. She and husband are discussing vasectomy.   Denies difficulty breathing or respiratory distress, chest pain, abdominal pain, excessive vaginal bleeding, dysuria, and leg pain or swelling.   The following portions of the patient's history were reviewed and updated as appropriate: allergies, current medications, past family history, past medical history, past social history, past surgical history and problem list.  Review of Systems  Pertinent items are noted in HPI.    Objective:    BP 123/89   Pulse 77   Ht 5\' 10"  (1.778 m)   Wt 247 lb 3.2 oz (112.1 kg)   LMP 01/23/2018 (Approximate)   Breastfeeding Yes   BMI 35.47 kg/m    General:  alert and no distress  Abdomen: soft, bowel sounds active, non-tender  Incision:   healing well, no drainage, no erythema, no hernia, no seroma, no swelling, no dehiscence, incision well approximated   Assessment:   Doing well postoperatively.   Plan:   Continue any current medications.  Wound care discussed.  Activity restrictions: no lifting more than 10 pounds  Sample of Slynd given, advised to start when four (4) weeks postpartum.   Reviewed red flag symptoms and when to call.   Follow up: 5 weeks for PPV or sooner if needed.    Diona Fanti, CNM [facility], [facility]  10/22/18 11:01 AM

## 2018-10-22 NOTE — Patient Instructions (Addendum)
Postpartum Care After Cesarean Delivery °This sheet gives you information about how to care for yourself from the time you deliver your baby to up to 6-12 weeks after delivery (postpartum period). Your health care provider may also give you more specific instructions. If you have problems or questions, contact your health care provider. °Follow these instructions at home: °Medicines °· Take over-the-counter and prescription medicines only as told by your health care provider. °· If you were prescribed an antibiotic medicine, take it as told by your health care provider. Do not stop taking the antibiotic even if you start to feel better. °· Ask your health care provider if the medicine prescribed to you: °? Requires you to avoid driving or using heavy machinery. °? Can cause constipation. You may need to take actions to prevent or treat constipation, such as: °§ Drink enough fluid to keep your urine pale yellow. °§ Take over-the-counter or prescription medicines. °§ Eat foods that are high in fiber, such as beans, whole grains, and fresh fruits and vegetables. °§ Limit foods that are high in fat and processed sugars, such as fried or sweet foods. °Activity °· Gradually return to your normal activities as told by your health care provider. °· Avoid activities that take a lot of effort and energy (are strenuous) until approved by your health care provider. Walking at a slow to moderate pace is usually safe. Ask your health care provider what activities are safe for you. °? Do not lift anything that is heavier than your baby or 10 lb (4.5 kg) as told by your health care provider. °? Do not vacuum, climb stairs, or drive a car for as long as told by your health care provider. °· If possible, have someone help you at home until you are able to do your usual activities yourself. °· Rest as much as possible. Try to rest or take naps while your baby is sleeping. °Vaginal bleeding °· It is normal to have vaginal bleeding  (lochia) after delivery. Wear a sanitary pad to absorb vaginal bleeding and discharge. °? During the first week after delivery, the amount and appearance of lochia is often similar to a menstrual period. °? Over the next few weeks, it will gradually decrease to a dry, yellow-brown discharge. °? For most women, lochia stops completely by 4-6 weeks after delivery. Vaginal bleeding can vary from woman to woman. °· Change your sanitary pads frequently. Watch for any changes in your flow, such as: °? A sudden increase in volume. °? A change in color. °? Large blood clots. °· If you pass a blood clot, save it and call your health care provider to discuss. Do not flush blood clots down the toilet before you get instructions from your health care provider. °· Do not use tampons or douches until your health care provider says this is safe. °· If you are not breastfeeding, your period should return 6-8 weeks after delivery. If you are breastfeeding, your period may return anytime between 8 weeks after delivery and the time that you stop breastfeeding. °Perineal care ° °· If your C-section (Cesarean section) was unplanned, and you were allowed to labor and push before delivery, you may have pain, swelling, and discomfort of the tissue between your vaginal opening and your anus (perineum). You may also have an incision in the tissue (episiotomy) or the tissue may have torn during delivery. Follow these instructions as told by your health care provider: °? Keep your perineum clean and dry as told by   your health care provider. Use medicated pads and pain-relieving sprays and creams as directed. °? If you have an episiotomy or vaginal tear, check the area every day for signs of infection. Check for: °§ Redness, swelling, or pain. °§ Fluid or blood. °§ Warmth. °§ Pus or a bad smell. °? You may be given a squirt bottle to use instead of wiping to clean the perineum area after you go to the bathroom. As you start healing, you may use  the squirt bottle before wiping yourself. Make sure to wipe gently. °? To relieve pain caused by an episiotomy, vaginal tear, or hemorrhoids, try taking a warm sitz bath 2-3 times a day. A sitz bath is a warm water bath that is taken while you are sitting down. The water should only come up to your hips and should cover your buttocks. °Breast care °· Within the first few days after delivery, your breasts may feel heavy, full, and uncomfortable (breast engorgement). You may also have milk leaking from your breasts. Your health care provider can suggest ways to help relieve breast discomfort. Breast engorgement should go away within a few days. °· If you are breastfeeding: °? Wear a bra that supports your breasts and fits you well. °? Keep your nipples clean and dry. Apply creams and ointments as told by your health care provider. °? You may need to use breast pads to absorb milk leakage. °? You may have uterine contractions every time you breastfeed for several weeks after delivery. Uterine contractions help your uterus return to its normal size. °? If you have any problems with breastfeeding, work with your health care provider or a lactation consultant. °· If you are not breastfeeding: °? Avoid touching your breasts as this can make your breasts produce more milk. °? Wear a well-fitting bra and use cold packs to help with swelling. °? Do not squeeze out (express) milk. This causes you to make more milk. °Intimacy and sexuality °· Ask your health care provider when you can engage in sexual activity. This may depend on your: °? Risk of infection. °? Healing rate. °? Comfort and desire to engage in sexual activity. °· You are able to get pregnant after delivery, even if you have not had your period. If desired, talk with your health care provider about methods of family planning or birth control (contraception). °Lifestyle °· Do not use any products that contain nicotine or tobacco, such as cigarettes, e-cigarettes,  and chewing tobacco. If you need help quitting, ask your health care provider. °· Do not drink alcohol, especially if you are breastfeeding. °Eating and drinking ° °· Drink enough fluid to keep your urine pale yellow. °· Eat high-fiber foods every day. These may help prevent or relieve constipation. High-fiber foods include: °? Whole grain cereals and breads. °? Brown rice. °? Beans. °? Fresh fruits and vegetables. °· Take your prenatal vitamins until your postpartum checkup or until your health care provider tells you it is okay to stop. °General instructions °· Keep all follow-up visits for you and your baby as told by your health care provider. Most women visit their health care provider for a postpartum checkup within the first 3-6 weeks after delivery. °Contact a health care provider if you: °· Feel unable to cope with the changes that a new baby brings to your life, and these feelings do not go away. °· Feel unusually sad or worried. °· Have breasts that are painful, hard, or turn red. °· Have a fever. °·   Have trouble holding urine or keeping urine from leaking.  Have little or no interest in activities you used to enjoy.  Have not breastfed at all and you have not had a menstrual period for 12 weeks after delivery.  Have stopped breastfeeding and you have not had a menstrual period for 12 weeks after you stopped breastfeeding.  Have questions about caring for yourself or your baby.  Pass a blood clot from your vagina. Get help right away if you:  Have chest pain.  Have difficulty breathing.  Have sudden, severe leg pain.  Have severe pain or cramping in your abdomen.  Bleed from your vagina so much that you fill more than one sanitary pad in one hour. Bleeding should not be heavier than your heaviest period.  Develop a severe headache.  Faint.  Have blurred vision or spots in your vision.  Have a bad-smelling vaginal discharge.  Have thoughts about hurting yourself or your  baby. If you ever feel like you may hurt yourself or others, or have thoughts about taking your own life, get help right away. You can go to your nearest emergency department or call:  Your local emergency services (911 in the [place]  A suicide crisis helpline, such as the National Suicide Prevention Lifeline at 503-587-30601-(603) 493-5847. This is open 24 hours a day. Summary  The period of time from when you deliver your baby to up to 6-12 weeks after delivery is called the postpartum period.  Gradually return to your normal activities as told by your health care provider.  Keep all follow-up visits for you and your baby as told by your health care provider. This information is not intended to replace advice given to you by your health care provider. Make sure you discuss any questions you have with your health care provider. Document Released: 03/25/2000 Document Revised: 11/15/2017 Document Reviewed: 11/15/2017 Elsevier Patient Education  2020 Elsevier Inc.  Breast Pumping Tips Breast pumping is a way to get milk out of your breasts. You will then store the milk for your baby to use when you are away from home. There are three ways to pump. You can:  Use your hand to massage and squeeze your breast (hand expression).  Use a hand-held machine to manually pump your milk.  Use an electric machine to pump your milk. In the beginning you may not get much milk. After a few days your breasts should make more. Pumping can help you start making milk after your baby is born. Pumping helps you to keep making milk when you are away from your baby. When should I pump? You can start pumping soon after your baby is born. Follow these tips:  When you are with your baby: ? Pump after you breastfeed. ? Pump from the free breast while you breastfeed.  When you are away from your baby: ? Pump every 2-3 hours for 15 minutes. ? Pump both breasts at the same time if you can.  If your baby drinks formula, pump  around the time your baby gets the formula.  If you drank alcohol, wait 2 hours before you pump.  If you are going to have surgery, ask your doctor when you should pump again. How do I get ready to pump? Take steps to relax. Try these things to help your milk come in:  Smell your baby's blanket or clothes.  Look at a picture or video of your baby.  Sit in a quiet, private space.  Massage your breast  and nipple.  Place a cloth on your breast. The cloth should be warm and a little wet.  Play relaxing music.  Picture your milk flowing. What are some tips? General tips for pumping breast milk   Always wash your hands before pumping.  If you do not get much milk or if pumping hurts, try different pump settings or a different kind of pump.  Drink enough fluid so your pee (urine) is clear or pale yellow.  Wear clothing that opens in the front or is easy to take off.  Pump milk into a clean bottle or container.  Do not use anything that has nicotine or tobacco. Examples are cigarettes and e-cigarettes. If you need help quitting, ask your doctor. Tips for storing breast milk   Store breast milk in a clean, BPA-free container. These include: ? A glass or plastic bottle. ? A milk storage bag.  Store only 2-4 ounces of breast milk in each container.  Swirl the breast milk in the container. Do not shake it.  Write down the date you pumped the milk on the container.  This is how long you can store breast milk: ? Room temperature: 6-8 hours. It is best to use the milk within 4 hours. ? Cooler with ice packs: 24 hours. ? Refrigerator: 5-8 days, if the milk is clean. It is best to use the milk within 3 days. ? Freezer: 9-12 months, if the milk is clean and stored away from the freezer door. It is best to use the milk within 6 months.  Put milk in the back of the refrigerator or freezer.  Thaw frozen milk using warm water. Do not use the microwave. Tips for choosing a breast  pump When choosing a pump, keep the following things in mind:  Manual breast pumps do not need electricity. They cost less. They can be hard to use.  Electric breast pumps use electricity. They are more expensive. They are easier to use. They collect more milk.  The suction cup (flange) should be the right size.  Before you buy the pump, check if your insurance will pay for it. Tips for caring for a breast pump  Check the manual that came with your pump for cleaning tips.  Clean the pump after you use it. To do this: 1. Wipe down the electrical part. Use a dry cloth or paper towel. Do not put this part in water or in cleaning products. 2. Wash the plastic parts with soap and warm water. Or use the dishwasher if the manual says it is safe. You do not need to clean the tubing unless it touched breast milk. 3. Let all the parts air dry. Avoid drying them with a cloth or towel. 4. When the parts are clean and dry, put the pump back together. Then store the pump.  If there is water in the tubing when you want to pump: 1. Attach the tubing to the pump. 2. Turn on the pump. 3. Turn off the pump when the tube is dry.  Try not to touch the inside of pump parts. Summary  Pumping can help you start making milk after your baby is born. It lets you keep making milk when you are away from your baby.  When you are away from your baby, pump for about 15 minutes every 2-3 hours. Pump both breasts at the same time, if you can. This information is not intended to replace advice given to you by your health care provider.  Make sure you discuss any questions you have with your health care provider. Document Released: 09/14/2007 Document Revised: 07/18/2018 Document Reviewed: 05/02/2016 Elsevier Patient Education  2020 [employer] Drospirenone tablets (contraception) What is this medicine? DROSPIRENONE (dro SPY re nown) is an oral contraceptive (birth control pill). The product contains a female  hormone known as a progestin. It is used to prevent pregnancy. This medicine may be used for other purposes; ask your health care provider or pharmacist if you have questions. COMMON BRAND NAME(S): FeRiva 21/7, SLYND What should I tell my health care provider before I take this medicine? They need to know if you have any of these conditions:  abnormal vaginal bleeding  adrenal gland disease  blood vessel disease or blood clots  breast, cervical, endometrial, ovarian, liver, or uterine cancer  diabetes  heart disease or recent heart attack  high potassium level  kidney disease  liver disease  mental depression  migraine headaches  stroke  an unusual or allergic reaction to drospirenone, progestins, or other medicines, foods, dyes, or preservatives  pregnant or trying to get pregnant  breast-feeding How should I use this medicine? Take this medicine by mouth. To reduce nausea, this medicine may be taken with food. Follow the directions on the prescription label. Take this medicine at the same time each day and in the order directed on the package. Do not take your medicine more often than directed. A patient package insert for the product will be given with each prescription and refill. Read this sheet carefully each time. The sheet may change frequently. Talk to your pediatrician regarding the use of this medicine in children. Special care may be needed. This medicine has been used in female children who have started having menstrual periods. Overdosage: If you think you have taken too much of this medicine contact a poison control center or emergency room at once. NOTE: This medicine is only for you. Do not share this medicine with others. What if I miss a dose? If you miss a dose, take it as soon as you can and refer to the patient information sheet you received with your medicine for direction. If you miss more than one pill, this medicine may not be as effective and you  may need to use another form of birth control. What may interact with this medicine? Do not take this medicine with any of the following medications:  atazanavir; cobicistat  bosentan  fosamprenavir This medicine may also interact with the following medications:  aprepitant  barbiturates like phenobarbital, primidone  carbamazepine  certain antibiotics like clarithromycin, rifampin, rifabutin, rifapentine  certain antivirals for HIV or hepatitis  certain diuretics like amiloride, spironolactone, triamterene  certain medicines for fungal infections like griseofulvin, ketoconazole, itraconazole, voriconazole  certain medicines for blood pressure, heart disease  cyclosporine  felbamate  heparin  medicines for diabetes  modafinil  NSAIDs, medicines for pain and inflammation, like ibuprofen or naproxen  oxcarbazepine  phenytoin  potassium supplements  rufinamide  St. John's wort  topiramate This list may not describe all possible interactions. Give your health care provider a list of all the medicines, herbs, non-prescription drugs, or dietary supplements you use. Also tell them if you smoke, drink alcohol, or use illegal drugs. Some items may interact with your medicine. What should I watch for while using this medicine? Visit your doctor or health care professional for regular checks on your progress. You will need a regular breast and pelvic exam and Pap smear while on this medicine.  You may need blood work done while you are taking this medicine. If you have any reason to think you are pregnant, stop taking this medicine right away and contact your doctor or health care professional. This medicine does not protect you against HIV infection (AIDS) or any other sexually transmitted diseases. If you are going to have elective surgery, you may need to stop taking this medicine before the surgery. Consult your health care professional for advice. What side effects  may I notice from receiving this medicine? Side effects that you should report to your doctor or health care professional as soon as possible:  allergic reactions like skin rash, itching or hives, swelling of the face, lips, or tongue  breast tissue changes or discharge  depressed mood  severe pain, swelling, or tenderness in the abdomen  signs and symptoms of a blood clot such as chest pain; shortness of breath; pain, swelling, or warmth in the leg  signs and symptoms of increased potassium like muscle weakness; chest pain; or fast, irregular heartbeat  signs and symptoms of liver injury like dark yellow or brown urine; general ill feeling or flu-like symptoms; light-colored stools; loss of appetite; nausea; right upper belly pain; unusually weak or tired; yellowing of the eyes or skin  signs and symptoms of a stroke like changes in vision; confusion; trouble speaking or understanding; severe headaches; sudden numbness or weakness of the face, arm or leg; trouble walking; dizziness; loss of balance or coordination  unusual vaginal bleeding  unusually weak or tired Side effects that usually do not require medical attention (report these to your doctor or health care professional if they continue or are bothersome):  acne  breast tenderness  headache  menstrual cramps  nausea  weight gain This list may not describe all possible side effects. Call your doctor for medical advice about side effects. You may report side effects to FDA at 1-800-FDA-1088. Where should I keep my medicine? Keep out of the reach of children. Store at room temperature between 20 and 25 degrees C (68 and 77 degrees F). Throw away any unused medicine after the expiration date. NOTE: This sheet is a summary. It may not cover all possible information. If you have questions about this medicine, talk to your doctor, pharmacist, or health care provider.  2020 Elsevier/Gold Standard (2017-09-06 15:01:56)

## 2018-10-22 NOTE — Lactation Note (Signed)
This note was copied from a baby's chart. Lactation Consultation Note  Patient Name: Girl Tytiana Coles SWHQP'R Date: 10/22/2018   Visiting mom in [place] today.  Mom was so excited to bring in breast milk for baby today.  She is no longer engorged.  Reminded importance of frequent pumping to prevent further engorgement.  Mom has Symphony pump with # 30 flanges set up at baby's bedside so she can pump whenever she is visiting.  Mom has extremely inverted nipples.  Mom is using Pump In Style breast pump at home and is on waiting list to get Symphony through [facility].  Offered to loan Symphony pump until she could get WIC pump.  Mom declined stating this pump is working fine for her for now.  Mom did not breastfeed others but did pump for 2 months with first and 6 months with second.  Encouraged mom to call us with any questions, concerns or assistance.  Maternal Data    Feeding Feeding Type: Breast Milk Nipple Type: Slow - flow  LATCH Score                   Interventions    Lactation Tools Discussed/Used     Consult Status      Jarold Motto 10/22/2018, 8:19 PM

## 2018-10-22 NOTE — Progress Notes (Signed)
Pt is present for incision check after c-section. Pt stated that the area is healing well, no swelling, odor, discharge or pain to the area.

## 2018-10-29 ENCOUNTER — Ambulatory Visit: Payer: Self-pay

## 2018-10-29 NOTE — Lactation Note (Signed)
This note was copied from a baby's chart. Lactation Consultation Note  Patient Name: Tasha Rodriguez JIRCV'E Date: 10/29/2018   Mom pumping in SCN now.  She expressed 2 bottles with 50 in each one.  She brought in 6 snappies with breast milk from home.  Mom reports the #30 flanges have seemed to work the best for pumping.  Mom's milk supply is increasing and she is no longer getting engorged.  Mom has not breast fed recently.  Mom's nipples are really large and extremely inverted even though the pump has pulled them out a little.  Mom is committed to just continue with pumping for now.  Encouraged mom to call us with any questions, concerns or assistance.    Maternal Data    Feeding Feeding Type: Bottle Fed - Breast Milk  LATCH Score                   Interventions    Lactation Tools Discussed/Used Tools: Bottle   Consult Status      Jarold Motto 10/29/2018, 7:26 PM

## 2018-10-31 ENCOUNTER — Encounter: Payer: Self-pay | Admitting: Certified Nurse Midwife

## 2018-11-01 ENCOUNTER — Other Ambulatory Visit: Payer: Self-pay

## 2018-11-01 ENCOUNTER — Encounter: Payer: Self-pay | Admitting: Certified Nurse Midwife

## 2018-11-01 ENCOUNTER — Ambulatory Visit (INDEPENDENT_AMBULATORY_CARE_PROVIDER_SITE_OTHER): Payer: Medicaid Other | Admitting: Certified Nurse Midwife

## 2018-11-01 VITALS — BP 115/78 | HR 73 | Ht 70.0 in | Wt 233.7 lb

## 2018-11-01 DIAGNOSIS — F53 Postpartum depression: Secondary | ICD-10-CM | POA: Diagnosis not present

## 2018-11-01 DIAGNOSIS — O99345 Other mental disorders complicating the puerperium: Secondary | ICD-10-CM

## 2018-11-01 MED ORDER — SERTRALINE HCL 50 MG PO TABS: 50.0000 mg | ORAL_TABLET | Freq: Every day | ORAL | 1 refills | Status: DC

## 2018-11-01 NOTE — Progress Notes (Signed)
Patient c/o crying spells, lack of motivation, "my baby is still in the hospital, everyone is asking about her, I just want to tell them she's dead, I don't feel any connection to her".

## 2018-11-01 NOTE — Patient Instructions (Addendum)
Postpartum Baby Blues The postpartum period begins right after the birth of a baby. During this time, there is often a lot of joy and excitement. It is also a time of many changes in the life of the parents. No matter how many times a mother gives birth, each child brings new challenges to the family, including different ways of relating to one another. It is common to have feelings of excitement along with confusing changes in moods, emotions, and thoughts. You may feel happy one minute and sad or stressed the next. These feelings of sadness usually happen in the period right after you have your baby, and they go away within a week or two. This is called the "baby blues." What are the causes? There is no known cause of baby blues. It is likely caused by a combination of factors. However, changes in hormone levels after childbirth are believed to trigger some of the symptoms. Other factors that can play a role in these mood changes include:  Lack of sleep.  Stressful life events, such as poverty, caring for a loved one, or death of a loved one.  Genetics. What are the signs or symptoms? Symptoms of this condition include:  Brief changes in mood, such as going from extreme happiness to sadness.  Decreased concentration.  Difficulty sleeping.  Crying spells and tearfulness.  Loss of appetite.  Irritability.  Anxiety. If the symptoms of baby blues last for more than 2 weeks or become more severe, you may have postpartum depression. How is this diagnosed? This condition is diagnosed based on an evaluation of your symptoms. There are no medical or lab tests that lead to a diagnosis, but there are various questionnaires that a health care provider may use to identify women with the baby blues or postpartum depression. How is this treated? Treatment is not needed for this condition. The baby blues usually go away on their own in 1-2 weeks. Social support is often all that is needed. You will  be encouraged to get adequate sleep and rest. Follow these instructions at home: Lifestyle      Get as much rest as you can. Take a nap when the baby sleeps.  Exercise regularly as told by your health care provider. Some women find yoga and walking to be helpful.  Eat a balanced and nourishing diet. This includes plenty of fruits and vegetables, whole grains, and lean proteins.  Do little things that you enjoy. Have a cup of tea, take a bubble bath, read your favorite magazine, or listen to your favorite music.  Avoid alcohol.  Ask for help with household chores, cooking, grocery shopping, or running errands. Do not try to do everything yourself. Consider hiring a postpartum doula to help. This is a professional who specializes in providing support to new mothers.  Try not to make any major life changes during pregnancy or right after giving birth. This can add stress. General instructions  Talk to people close to you about how you are feeling. Get support from your partner, family members, friends, or other new moms. You may want to join a support group.  Find ways to cope with stress. This may include: ? Writing your thoughts and feelings in a journal. ? Spending time outside. ? Spending time with people who make you laugh.  Try to stay positive in how you think. Think about the things you are grateful for.  Take over-the-counter and prescription medicines only as told by your health care provider.    Let your health care provider know if you have any concerns.  Keep all postpartum visits as told by your health care provider. This is important. Contact a health care provider if:  Your baby blues do not go away after 2 weeks. Get help right away if:  You have thoughts of taking your own life (suicidal thoughts).  You think you may harm the baby or other people.  You see or hear things that are not there (hallucinations). Summary  After giving birth, you may feel happy  one minute and sad or stressed the next. Feelings of sadness that happen right after the baby is born and go away after a week or two are called the "baby blues."  You can manage the baby blues by getting enough rest, eating a healthy diet, exercising, spending time with supportive people, and finding ways to cope with stress.  If feelings of sadness and stress last longer than 2 weeks or get in the way of caring for your baby, talk to your health care provider. This may mean you have postpartum depression. This information is not intended to replace advice given to you by your health care provider. Make sure you discuss any questions you have with your health care provider. Document Released: 12/31/2003 Document Revised: 07/20/2018 Document Reviewed: 05/24/2016 Elsevier Patient Education  [employer]. Sertraline tablets What is this medicine? SERTRALINE (SER tra leen) is used to treat depression. It may also be used to treat obsessive compulsive disorder, panic disorder, post-trauma stress, premenstrual dysphoric disorder (PMDD) or social anxiety. This medicine may be used for other purposes; ask your health care provider or pharmacist if you have questions. COMMON BRAND NAME(S): Zoloft What should I tell my health care provider before I take this medicine? They need to know if you have any of these conditions:  bleeding disorders  bipolar disorder or a family history of bipolar disorder  glaucoma  heart disease  high blood pressure  history of irregular heartbeat  history of low levels of calcium, magnesium, or potassium in the blood  if you often drink alcohol  liver disease  receiving electroconvulsive therapy  seizures  suicidal thoughts, plans, or attempt; a previous suicide attempt by you or a family member  take medicines that treat or prevent blood clots  thyroid disease  an unusual or allergic reaction to sertraline, other medicines, foods, dyes, or  preservatives  pregnant or trying to get pregnant  breast-feeding How should I use this medicine? Take this medicine by mouth with a glass of water. Follow the directions on the prescription label. You can take it with or without food. Take your medicine at regular intervals. Do not take your medicine more often than directed. Do not stop taking this medicine suddenly except upon the advice of your doctor. Stopping this medicine too quickly may cause serious side effects or your condition may worsen. A special MedGuide will be given to you by the pharmacist with each prescription and refill. Be sure to read this information carefully each time. Talk to your pediatrician regarding the use of this medicine in children. While this drug may be prescribed for children as young as 7 years for selected conditions, precautions do apply. Overdosage: If you think you have taken too much of this medicine contact a poison control center or emergency room at once. NOTE: This medicine is only for you. Do not share this medicine with others. What if I miss a dose? If you miss a dose, take  it as soon as you can. If it is almost time for your next dose, take only that dose. Do not take double or extra doses. What may interact with this medicine? Do not take this medicine with any of the following medications:  cisapride  dronedarone  linezolid  MAOIs like Carbex, Eldepryl, Marplan, Nardil, and Parnate  methylene blue (injected into a vein)  pimozide  thioridazine This medicine may also interact with the following medications:  alcohol  amphetamines  aspirin and aspirin-like medicines  certain medicines for depression, anxiety, or psychotic disturbances  certain medicines for fungal infections like ketoconazole, fluconazole, posaconazole, and itraconazole  certain medicines for irregular heart beat like flecainide, quinidine, propafenone  certain medicines for migraine headaches like  almotriptan, eletriptan, frovatriptan, naratriptan, rizatriptan, sumatriptan, zolmitriptan  certain medicines for sleep  certain medicines for seizures like carbamazepine, valproic acid, phenytoin  certain medicines that treat or prevent blood clots like warfarin, enoxaparin, dalteparin  cimetidine  digoxin  diuretics  fentanyl  isoniazid  lithium  NSAIDs, medicines for pain and inflammation, like ibuprofen or naproxen  other medicines that prolong the QT interval (cause an abnormal heart rhythm) like dofetilide  rasagiline  safinamide  supplements like St. John's wort, kava kava, valerian  tolbutamide  tramadol  tryptophan This list may not describe all possible interactions. Give your health care provider a list of all the medicines, herbs, non-prescription drugs, or dietary supplements you use. Also tell them if you smoke, drink alcohol, or use illegal drugs. Some items may interact with your medicine. What should I watch for while using this medicine? Tell your doctor if your symptoms do not get better or if they get worse. Visit your doctor or health care professional for regular checks on your progress. Because it may take several weeks to see the full effects of this medicine, it is important to continue your treatment as prescribed by your doctor. Patients and their families should watch out for new or worsening thoughts of suicide or depression. Also watch out for sudden changes in feelings such as feeling anxious, agitated, panicky, irritable, hostile, aggressive, impulsive, severely restless, overly excited and hyperactive, or not being able to sleep. If this happens, especially at the beginning of treatment or after a change in dose, call your health care professional. Tasha QuinYou may get drowsy or dizzy. Do not drive, use machinery, or do anything that needs mental alertness until you know how this medicine affects you. Do not stand or sit up quickly, especially if you are  an older patient. This reduces the risk of dizzy or fainting spells. Alcohol may interfere with the effect of this medicine. Avoid alcoholic drinks. Your mouth may get dry. Chewing sugarless gum or sucking hard candy, and drinking plenty of water may help. Contact your doctor if the problem does not go away or is severe. What side effects may I notice from receiving this medicine? Side effects that you should report to your doctor or health care professional as soon as possible:  allergic reactions like skin rash, itching or hives, swelling of the face, lips, or tongue  anxious  black, tarry stools  changes in vision  confusion  elevated mood, decreased need for sleep, racing thoughts, impulsive behavior  eye pain  fast, irregular heartbeat  feeling faint or lightheaded, falls  feeling agitated, angry, or irritable  hallucination, loss of contact with reality  loss of balance or coordination  loss of memory  painful or prolonged erections  restlessness, pacing, inability to  keep still  seizures  stiff muscles  suicidal thoughts or other mood changes  trouble sleeping  unusual bleeding or bruising  unusually weak or tired  vomiting Side effects that usually do not require medical attention (report to your doctor or health care professional if they continue or are bothersome):  change in appetite or weight  change in sex drive or performance  diarrhea  increased sweating  indigestion, nausea  tremors This list may not describe all possible side effects. Call your doctor for medical advice about side effects. You may report side effects to FDA at 1-800-FDA-1088. Where should I keep my medicine? Keep out of the reach of children. Store at room temperature between 15 and 30 degrees C (59 and 86 degrees F). Throw away any unused medicine after the expiration date. NOTE: This sheet is a summary. It may not cover all possible information. If you have questions  about this medicine, talk to your doctor, pharmacist, or health care provider.  2020 Elsevier/Gold Standard (2018-03-20 10:09:27)

## 2018-11-01 NOTE — Progress Notes (Signed)
GYN ENCOUNTER NOTE  Subjective:       Tasha Rodriguez is a 38 y.o. [phone] female is here for gynecologic evaluation of the following issues:  1. Postpartum depression  Reports crying spells and lack of motivation. Notes her "baby is still in the hospital, everyone is asking about her, I just want to tell them she's dead".   Patient is concerned because she "doesn't feel maternal connection".  Reluctant to take medication, see counselor, or participate in support groups at this time.    No SI/HI. Denies difficulty breathing or respiratory distress, chest pain, abdominal pain, excessive vaginal bleeding, dysuria, and leg pain or swelling.    Gynecologic History  Patient's last menstrual period was 01/23/2018 (approximate).  Contraception: abstinence  Last Pap: 10/2016. Results were: normal  Obstetric History  OB History  Gravida Para Term Preterm AB Living  4 3 3   1 3   SAB TAB Ectopic Multiple Live Births  1     0 3    # Outcome Date GA Lbr Len/2nd Weight Sex Delivery Anes PTL Lv  4 Term 10/14/18 [id number]  4 lb 6.9 oz (2.01 kg) F CS-LTranv   LIV  3 SAB 2018 [id number]         2 Term 11/11/09   7 lb 5 oz (3.317 kg) F CS-Unspec  N LIV  1 Term 11/12/99   7 lb 9.6 oz (3.447 kg) F CS-Unspec EPI  LIV     Complications: Failure to Progress in First Stage, Fetal Intolerance    Past Medical History:  Diagnosis Date  . Cervical dysplasia 2010  . Diabetes mellitus without complication (HCC)   . HPV (human papilloma virus) infection     Past Surgical History:  Procedure Laterality Date  . CESAREAN SECTION    . CESAREAN SECTION N/A 10/14/2018   Procedure: CESAREAN SECTION;  Surgeon: Linzie CollinEvans, David James, MD;  Location: [facility] ORS;  Service: Obstetrics;  Laterality: N/A;  . LEEP  2010    Current Outpatient Medications on File Prior to Visit  Medication Sig Dispense Refill  . cholecalciferol (VITAMIN D) 1000 units tablet Take 1,000 Units by mouth daily.    Marland Kitchen. ibuprofen (ADVIL) 800 MG  tablet Take 1 tablet (800 mg total) by mouth every 6 (six) hours. 30 tablet 0  . oxyCODONE-acetaminophen (PERCOCET/ROXICET) 5-325 MG tablet Take 1-2 tablets by mouth every 4 (four) hours as needed for moderate pain. 30 tablet 0  . Prenatal Vit-Fe Fumarate-FA (PRENATAL MULTIVITAMIN) TABS tablet Take 1 tablet by mouth daily at 12 noon.     No current facility-administered medications on file prior to visit.     No Known Allergies  Social History   Socioeconomic History  . Marital status: Single    Spouse name: Not on file  . Number of children: Not on file  . Years of education: Not on file  . Highest education level: Not on file  Occupational History  . Not on file  Social Needs  . Financial resource strain: Not on file  . Food insecurity    Worry: Not on file    Inability: Not on file  . Transportation needs    Medical: Not on file    Non-medical: Not on file  Tobacco Use  . Smoking status: Never Smoker  . Smokeless tobacco: Never Used  Substance and Sexual Activity  . Alcohol use: Not Currently  . Drug use: No  . Sexual activity: Not Currently    Birth control/protection: Surgical  Comment: tubal?? patient unsure if done  Lifestyle  . Physical activity    Days per week: Not on file    Minutes per session: Not on file  . Stress: Not on file  Relationships  . Social Herbalist on phone: Not on file    Gets together: Not on file    Attends religious service: Not on file    Active member of club or organization: Not on file    Attends meetings of clubs or organizations: Not on file    Relationship status: Not on file  . Intimate partner violence    Fear of current or ex partner: Not on file    Emotionally abused: Not on file    Physically abused: Not on file    Forced sexual activity: Not on file  Other Topics Concern  . Not on file  Social History Narrative  . Not on file    Family History  Problem Relation Age of Onset  . Diabetes Mother   .  Ovarian cancer Mother 49       s/p hyst, no chemo/rad; question cx  . Diabetes Father   . Liver disease Brother   . Diabetes Maternal Aunt   . Diabetes Maternal Uncle   . Diabetes Maternal Grandmother   . Diabetes Maternal Grandfather   . Breast cancer Neg Hx   . Colon cancer Neg Hx     The following portions of the patient's history were reviewed and updated as appropriate: allergies, current medications, past family history, past medical history, past social history, past surgical history and problem list.  Review of Systems  ROS negative except as noted above. Information obtained from patient.   Objective:   BP 115/78   Pulse 73   Ht 5\' 10"  (1.778 m)   Wt 233 lb 11.2 oz (106 kg)   LMP 01/23/2018 (Approximate)   Breastfeeding Yes   BMI 33.53 kg/m    CONSTITUTIONAL: Well-developed, well-nourished female in no acute distress.   PHYSICAL EXAM: Not indicated.   Depression screen [facility] 2/9 11/01/2018 06/13/2018  Decreased Interest 3 0  Down, Depressed, Hopeless 3 0  PHQ - 2 Score 6 0  Altered sleeping 3 -  Tired, decreased energy 3 -  Change in appetite 3 -  Feeling bad or failure about yourself  3 -  Trouble concentrating 3 -  Moving slowly or fidgety/restless 3 -  Suicidal thoughts 3 -  PHQ-9 Score 27 -  Difficult doing work/chores Somewhat difficult -   GAD 7 : Generalized Anxiety Score 11/01/2018  Nervous, Anxious, on Edge 3  Control/stop worrying 3  Worry too much - different things 0  Trouble relaxing 3  Restless 0  Easily annoyed or irritable 3  Afraid - awful might happen 3  Total GAD 7 Score 15  Anxiety Difficulty Very difficult    Assessment:   1. Postpartum depression   Plan:   Extensive conversation regarding available resources including therapy, counseling, medication and support groups.   Rx: Zoloft, see orders.   Pamphlet given for [facility] and Support Groups.   MyChart message with resources provided, see chart.    Contacted OB Care Manager about available resources and patient support options.   Referral to lactation, see orders.   Verbal contracted completed to notify with SI/HI, questions or concerns.   Reviewed red flag symptoms and when to call.   RTC x 2 weeks for mood check or sooner if needed.  Gunnar BullaJenkins Michelle Aliviana Burdell, CNM [facility], [facility] 11/01/18 4:46 PM

## 2018-11-02 ENCOUNTER — Telehealth: Payer: Self-pay

## 2018-11-02 NOTE — Telephone Encounter (Signed)
Called and spoke to Pharmacist [place], verified that patient picked up Zoloft prescription.  Per Pharmacist Colletta [place], patient picked it up on 11/01/2018.

## 2018-11-26 ENCOUNTER — Encounter: Payer: Self-pay | Admitting: Certified Nurse Midwife

## 2018-11-26 ENCOUNTER — Ambulatory Visit (INDEPENDENT_AMBULATORY_CARE_PROVIDER_SITE_OTHER): Payer: Medicaid Other | Admitting: Certified Nurse Midwife

## 2018-11-26 ENCOUNTER — Other Ambulatory Visit: Payer: Self-pay

## 2018-11-26 DIAGNOSIS — O99345 Other mental disorders complicating the puerperium: Secondary | ICD-10-CM

## 2018-11-26 DIAGNOSIS — F53 Postpartum depression: Secondary | ICD-10-CM

## 2018-11-26 DIAGNOSIS — Z8632 Personal history of gestational diabetes: Secondary | ICD-10-CM

## 2018-11-26 MED ORDER — SERTRALINE HCL 50 MG PO TABS: 50.0000 mg | ORAL_TABLET | Freq: Every day | ORAL | 2 refills | Status: DC

## 2018-11-26 NOTE — Progress Notes (Signed)
Subjective:    Tasha Rodriguez is a 38 y.o. [phone] Caucasian female who presents for a postpartum visit. She is 6 weeks postpartum following a repeat cesarean section, low transverse incision at 37+5 gestational weeks for non-reassuring fetal heart tones, previous cesarean section x 2 and gestational diabetes. Anesthesia: spinal. I have fully reviewed the prenatal and intrapartum course.   Postpartum course has been complicated by postpartum depression requiring medication. Taking Zoloft 50 mg daily.   Baby's course has been uncomplicated. Baby is feeding by expressed breastmilk through bottle.   Bleeding no bleeding. Bowel function is normal. Bladder function is normal.   Patient is not sexually active.Contraception method is oral progesterone-only contraceptive, Slynd.   Postpartum depression screening: negative. Score 3.  Last pap 10/2016 and was Negative/Negative.  Denies difficulty breathing or respiratory distress, chest pain, abdominal pain, vaginal bleeding, dysuria, and leg pain or swelling.   The following portions of the patient's history were reviewed and updated as appropriate: allergies, current medications, past medical history, past surgical history and problem list.  Review of Systems  Pertinent items are noted in HPI.   Objective:   BP 100/64   Pulse (!) 59   Ht 5\' 10"  (1.778 m)   Wt 234 lb 3.2 oz (106.2 kg)   LMP 01/23/2018 (Approximate)   Breastfeeding Yes   BMI 33.60 kg/m   General:  alert, cooperative and no distress   Breasts:  deferred, no complaints  Lungs: clear to auscultation bilaterally  Heart:  regular rate and rhythm  Abdomen: soft, nontender   Vulva: normal  Vagina: normal vagina  Cervix:  closed  Corpus: Well-involuted  Adnexa:  Non-palpable    Depression screen [facility] 2/9 11/26/2018 11/01/2018 06/13/2018  Decreased Interest 0 3 0  Down, Depressed, Hopeless 0 3 0  PHQ - 2 Score 0 6 0  Altered sleeping 0 3 -  Tired, decreased energy 3 3  -  Change in appetite 0 3 -  Feeling bad or failure about yourself  0 3 -  Trouble concentrating 0 3 -  Moving slowly or fidgety/restless 0 3 -  Suicidal thoughts 0 3 -  PHQ-9 Score 3 27 -  Difficult doing work/chores Not difficult at all Somewhat difficult -   GAD 7 : Generalized Anxiety Score 11/26/2018 11/01/2018  Nervous, Anxious, on Edge 0 3  Control/stop worrying 0 3  Worry too much - different things 0 0  Trouble relaxing 0 3  Restless 0 0  Easily annoyed or irritable 0 3  Afraid - awful might happen 0 3  Total GAD 7 Score 0 15  Anxiety Difficulty - Very difficult         Assessment:   Postpartum exam Six (6) wks s/p repeat cesarean section  Breastfeeding via expressed milk Depression screening Contraception counseling   Plan:   Continue medication as prescribed. Rx Zoloft, see orders.   Encouraged routine health maintenance techniques.   Samples of Slynd provided.   Reviewed red flag symptoms and when to call.   Follow up in: 3 months for ANNUAL EXAM and Labs (A1c) or earlier if needed.   Diona Fanti, CNM [facility], [facility] 11/26/18 11:33 AM

## 2018-11-26 NOTE — Patient Instructions (Addendum)
Preventive Care 21-37 Years Old, Female Preventive care refers to visits with your health care provider and lifestyle choices that can promote health and wellness. This includes:  A yearly physical exam. This may also be called an annual well check.  Regular dental visits and eye exams.  Immunizations.  Screening for certain conditions.  Healthy lifestyle choices, such as eating a healthy diet, getting regular exercise, not using drugs or products that contain nicotine and tobacco, and limiting alcohol use. What can I expect for my preventive care visit? Physical exam Your health care provider will check your:  Height and weight. This may be used to calculate body mass index (BMI), which tells if you are at a healthy weight.  Heart rate and blood pressure.  Skin for abnormal spots. Counseling Your health care provider may ask you questions about your:  Alcohol, tobacco, and drug use.  Emotional well-being.  Home and relationship well-being.  Sexual activity.  Eating habits.  Work and work environment.  Method of birth control.  Menstrual cycle.  Pregnancy history. What immunizations do I need?  Influenza (flu) vaccine  This is recommended every year. Tetanus, diphtheria, and pertussis (Tdap) vaccine  You may need a Td booster every 10 years. Varicella (chickenpox) vaccine  You may need this if you have not been vaccinated. Human papillomavirus (HPV) vaccine  If recommended by your health care provider, you may need three doses over 6 months. Measles, mumps, and rubella (MMR) vaccine  You may need at least one dose of MMR. You may also need a second dose. Meningococcal conjugate (MenACWY) vaccine  One dose is recommended if you are age 19-21 years and a first-year college student living in a residence hall, or if you have one of several medical conditions. You may also need additional booster doses. Pneumococcal conjugate (PCV13) vaccine  You may need  this if you have certain conditions and were not previously vaccinated. Pneumococcal polysaccharide (PPSV23) vaccine  You may need one or two doses if you smoke cigarettes or if you have certain conditions. Hepatitis A vaccine  You may need this if you have certain conditions or if you travel or work in places where you may be exposed to hepatitis A. Hepatitis B vaccine  You may need this if you have certain conditions or if you travel or work in places where you may be exposed to hepatitis B. Haemophilus influenzae type b (Hib) vaccine  You may need this if you have certain conditions. You may receive vaccines as individual doses or as more than one vaccine together in one shot (combination vaccines). Talk with your health care provider about the risks and benefits of combination vaccines. What tests do I need?  Blood tests  Lipid and cholesterol levels. These may be checked every 5 years starting at age 20.  Hepatitis C test.  Hepatitis B test. Screening  Diabetes screening. This is done by checking your blood sugar (glucose) after you have not eaten for a while (fasting).  Sexually transmitted disease (STD) testing.  BRCA-related cancer screening. This may be done if you have a family history of breast, ovarian, tubal, or peritoneal cancers.  Pelvic exam and Pap test. This may be done every 3 years starting at age 21. Starting at age 30, this may be done every 5 years if you have a Pap test in combination with an HPV test. Talk with your health care provider about your test results, treatment options, and if necessary, the need for more tests.   Follow these instructions at home: Eating and drinking   Eat a diet that includes fresh fruits and vegetables, whole grains, lean protein, and low-fat dairy.  Take vitamin and mineral supplements as recommended by your health care provider.  Do not drink alcohol if: ? Your health care provider tells you not to drink. ? You are  pregnant, may be pregnant, or are planning to become pregnant.  If you drink alcohol: ? Limit how much you have to 0-1 drink a day. ? Be aware of how much alcohol is in your drink. In the [place] one drink equals one 12 oz bottle of beer (355 mL), one 5 oz glass of wine (148 mL), or one 1 oz glass of hard liquor (44 mL). Lifestyle  Take daily care of your teeth and gums.  Stay active. Exercise for at least 30 minutes on 5 or more days each week.  Do not use any products that contain nicotine or tobacco, such as cigarettes, e-cigarettes, and chewing tobacco. If you need help quitting, ask your health care provider.  If you are sexually active, practice safe sex. Use a condom or other form of birth control (contraception) in order to prevent pregnancy and STIs (sexually transmitted infections). If you plan to become pregnant, see your health care provider for a preconception visit. What's next?  Visit your health care provider once a year for a well check visit.  Ask your health care provider how often you should have your eyes and teeth checked.  Stay up to date on all vaccines. This information is not intended to replace advice given to you by your health care provider. Make sure you discuss any questions you have with your health care provider. Document Released: 05/24/2001 Document Revised: 12/07/2017 Document Reviewed: 12/07/2017 Elsevier Patient Education  [employer].  Drospirenone tablets (contraception) What is this medicine? DROSPIRENONE (dro SPY re nown) is an oral contraceptive (birth control pill). The product contains a female hormone known as a progestin. It is used to prevent pregnancy. This medicine may be used for other purposes; ask your health care provider or pharmacist if you have questions. COMMON BRAND NAME(S): FeRiva 21/7, SLYND What should I tell my health care provider before I take this medicine? They need to know if you have any of these conditions:   abnormal vaginal bleeding  adrenal gland disease  blood vessel disease or blood clots  breast, cervical, endometrial, ovarian, liver, or uterine cancer  diabetes  heart disease or recent heart attack  high potassium level  kidney disease  liver disease  mental depression  migraine headaches  stroke  an unusual or allergic reaction to drospirenone, progestins, or other medicines, foods, dyes, or preservatives  pregnant or trying to get pregnant  breast-feeding How should I use this medicine? Take this medicine by mouth. To reduce nausea, this medicine may be taken with food. Follow the directions on the prescription label. Take this medicine at the same time each day and in the order directed on the package. Do not take your medicine more often than directed. A patient package insert for the product will be given with each prescription and refill. Read this sheet carefully each time. The sheet may change frequently. Talk to your pediatrician regarding the use of this medicine in children. Special care may be needed. This medicine has been used in female children who have started having menstrual periods. Overdosage: If you think you have taken too much of this medicine contact a poison control  center or emergency room at once. NOTE: This medicine is only for you. Do not share this medicine with others. What if I miss a dose? If you miss a dose, take it as soon as you can and refer to the patient information sheet you received with your medicine for direction. If you miss more than one pill, this medicine may not be as effective and you may need to use another form of birth control. What may interact with this medicine? Do not take this medicine with any of the following medications:  atazanavir; cobicistat  bosentan  fosamprenavir This medicine may also interact with the following medications:  aprepitant  barbiturates like phenobarbital, primidone  carbamazepine   certain antibiotics like clarithromycin, rifampin, rifabutin, rifapentine  certain antivirals for HIV or hepatitis  certain diuretics like amiloride, spironolactone, triamterene  certain medicines for fungal infections like griseofulvin, ketoconazole, itraconazole, voriconazole  certain medicines for blood pressure, heart disease  cyclosporine  felbamate  heparin  medicines for diabetes  modafinil  NSAIDs, medicines for pain and inflammation, like ibuprofen or naproxen  oxcarbazepine  phenytoin  potassium supplements  rufinamide  St. John's wort  topiramate This list may not describe all possible interactions. Give your health care provider a list of all the medicines, herbs, non-prescription drugs, or dietary supplements you use. Also tell them if you smoke, drink alcohol, or use illegal drugs. Some items may interact with your medicine. What should I watch for while using this medicine? Visit your doctor or health care professional for regular checks on your progress. You will need a regular breast and pelvic exam and Pap smear while on this medicine. You may need blood work done while you are taking this medicine. If you have any reason to think you are pregnant, stop taking this medicine right away and contact your doctor or health care professional. This medicine does not protect you against HIV infection (AIDS) or any other sexually transmitted diseases. If you are going to have elective surgery, you may need to stop taking this medicine before the surgery. Consult your health care professional for advice. What side effects may I notice from receiving this medicine? Side effects that you should report to your doctor or health care professional as soon as possible:  allergic reactions like skin rash, itching or hives, swelling of the face, lips, or tongue  breast tissue changes or discharge  depressed mood  severe pain, swelling, or tenderness in the abdomen   signs and symptoms of a blood clot such as chest pain; shortness of breath; pain, swelling, or warmth in the leg  signs and symptoms of increased potassium like muscle weakness; chest pain; or fast, irregular heartbeat  signs and symptoms of liver injury like dark yellow or brown urine; general ill feeling or flu-like symptoms; light-colored stools; loss of appetite; nausea; right upper belly pain; unusually weak or tired; yellowing of the eyes or skin  signs and symptoms of a stroke like changes in vision; confusion; trouble speaking or understanding; severe headaches; sudden numbness or weakness of the face, arm or leg; trouble walking; dizziness; loss of balance or coordination  unusual vaginal bleeding  unusually weak or tired Side effects that usually do not require medical attention (report these to your doctor or health care professional if they continue or are bothersome):  acne  breast tenderness  headache  menstrual cramps  nausea  weight gain This list may not describe all possible side effects. Call your doctor for medical advice about  side effects. You may report side effects to FDA at 1-800-FDA-1088. Where should I keep my medicine? Keep out of the reach of children. Store at room temperature between 20 and 25 degrees C (68 and 77 degrees F). Throw away any unused medicine after the expiration date. NOTE: This sheet is a summary. It may not cover all possible information. If you have questions about this medicine, talk to your doctor, pharmacist, or health care provider.  2020 Elsevier/Gold Standard (2017-09-06 15:01:56)

## 2018-11-26 NOTE — Progress Notes (Signed)
Patient here for post partum visit.  No complaints.

## 2019-01-01 ENCOUNTER — Other Ambulatory Visit (HOSPITAL_COMMUNITY)
Admission: RE | Admit: 2019-01-01 | Discharge: 2019-01-01 | Disposition: A | Discharge status: Home / Self Care | Payer: Medicaid Other | Source: Ambulatory Visit | Attending: Certified Nurse Midwife | Admitting: Certified Nurse Midwife

## 2019-01-01 ENCOUNTER — Other Ambulatory Visit: Payer: Self-pay

## 2019-01-01 ENCOUNTER — Encounter: Payer: Self-pay | Admitting: Certified Nurse Midwife

## 2019-01-01 ENCOUNTER — Ambulatory Visit (INDEPENDENT_AMBULATORY_CARE_PROVIDER_SITE_OTHER): Payer: Medicaid Other | Admitting: Certified Nurse Midwife

## 2019-01-01 VITALS — BP 96/62 | HR 71 | Ht 70.0 in | Wt 238.4 lb

## 2019-01-01 DIAGNOSIS — N912 Amenorrhea, unspecified: Secondary | ICD-10-CM

## 2019-01-01 DIAGNOSIS — N898 Other specified noninflammatory disorders of vagina: Secondary | ICD-10-CM | POA: Insufficient documentation

## 2019-01-01 DIAGNOSIS — Z302 Encounter for sterilization: Secondary | ICD-10-CM | POA: Insufficient documentation

## 2019-01-01 DIAGNOSIS — Z793 Long term (current) use of hormonal contraceptives: Secondary | ICD-10-CM

## 2019-01-01 DIAGNOSIS — F53 Postpartum depression: Secondary | ICD-10-CM

## 2019-01-01 DIAGNOSIS — O99345 Other mental disorders complicating the puerperium: Secondary | ICD-10-CM | POA: Diagnosis not present

## 2019-01-01 MED ORDER — SERTRALINE HCL 100 MG PO TABS: 100.0000 mg | ORAL_TABLET | Freq: Every day | ORAL | 2 refills | Status: DC

## 2019-01-01 NOTE — Progress Notes (Signed)
Patient c/o nausea x2 weeks, thick white vaginal discharge x2 days "like I've seen before when I was pregnant" and having pregnancy dreams x1 week.

## 2019-01-01 NOTE — Progress Notes (Signed)
GYN ENCOUNTER NOTE  Subjective:       Tasha Rodriguez is a 38 y.o. [phone] female is here for gynecologic evaluation of the following issues:  1. Nausea without vomiting for the last two (2) weeks 2. Vaginal discharge for the last two (2) days 3. Pregnancy dreams for the last week  Desires refill of Zoloft and permanent sterilization. Tubal ligation not completed during repeat cesarean section, because consent not completed.   Concerned for pregnancy.   Denies difficulty breathing or respiratory distress, chest pain, abdominal pain, vaginal bleeding, dysuria, and leg pain or swelling.    Gynecologic History  No LMP recorded.  Contraception: oral progesterone-only contraceptive, Slynd  Last Pap: 2018. Results were: Negative/Negative  Obstetric History  OB History  Gravida Para Term Preterm AB Living  4 3 3   1 3   SAB TAB Ectopic Multiple Live Births  1     0 3    # Outcome Date GA Lbr Len/2nd Weight Sex Delivery Anes PTL Lv  4 Term 10/14/18 [redacted]w[redacted]d  4 lb 6.9 oz (2.01 kg) F CS-LTranv   LIV  3 SAB 2018 [redacted]w[redacted]d         2 Term 11/11/09   7 lb 5 oz (3.317 kg) F CS-Unspec  N LIV  1 Term 11/12/99   7 lb 9.6 oz (3.447 kg) F CS-Unspec EPI  LIV     Complications: Failure to Progress in First Stage, Fetal Intolerance    Past Medical History:  Diagnosis Date  . Cervical dysplasia 2010  . Diabetes mellitus without complication ([place])   . HPV (human papilloma virus) infection     Past Surgical History:  Procedure Laterality Date  . CESAREAN SECTION    . CESAREAN SECTION N/A 10/14/2018   Procedure: CESAREAN SECTION;  Surgeon: Harlin Heys, MD;  Location: [facility] ORS;  Service: Obstetrics;  Laterality: N/A;  . LEEP  2010    Current Outpatient Medications on File Prior to Visit  Medication Sig Dispense Refill  . cholecalciferol (VITAMIN D) 1000 units tablet Take 1,000 Units by mouth daily.    . Drospirenone (SLYND) 4 MG TABS Take by mouth.    . Prenatal Vit-Fe Fumarate-FA  (PRENATAL MULTIVITAMIN) TABS tablet Take 1 tablet by mouth daily at 12 noon.     No current facility-administered medications on file prior to visit.     No Known Allergies  Social History   Socioeconomic History  . Marital status: Single    Spouse name: Not on file  . Number of children: Not on file  . Years of education: Not on file  . Highest education level: Not on file  Occupational History  . Not on file  Social Needs  . Financial resource strain: Not on file  . Food insecurity    Worry: Not on file    Inability: Not on file  . Transportation needs    Medical: Not on file    Non-medical: Not on file  Tobacco Use  . Smoking status: Never Smoker  . Smokeless tobacco: Never Used  Substance and Sexual Activity  . Alcohol use: Not Currently  . Drug use: No  . Sexual activity: Yes    Birth control/protection: Surgical    Comment: tubal?? patient unsure if done  Lifestyle  . Physical activity    Days per week: Not on file    Minutes per session: Not on file  . Stress: Not on file  Relationships  . Social connections  Talks on phone: Not on file    Gets together: Not on file    Attends religious service: Not on file    Active member of club or organization: Not on file    Attends meetings of clubs or organizations: Not on file    Relationship status: Not on file  . Intimate partner violence    Fear of current or ex partner: Not on file    Emotionally abused: Not on file    Physically abused: Not on file    Forced sexual activity: Not on file  Other Topics Concern  . Not on file  Social History Narrative  . Not on file    Family History  Problem Relation Age of Onset  . Diabetes Mother   . Ovarian cancer Mother 51       s/p hyst, no chemo/rad; question cx  . Diabetes Father   . Liver disease Brother   . Diabetes Maternal Aunt   . Diabetes Maternal Uncle   . Diabetes Maternal Grandmother   . Diabetes Maternal Grandfather   . Breast cancer Neg Hx   .  Colon cancer Neg Hx     The following portions of the patient's history were reviewed and updated as appropriate: allergies, current medications, past family history, past medical history, past social history, past surgical history and problem list.  Review of Systems  ROS negative except as noted above. Information obtained from patient.   Objective:   BP 96/62   Pulse 71   Ht 5\' 10"  (1.778 m)   Wt 238 lb 6.4 oz (108.1 kg)   Breastfeeding Yes   BMI 34.21 kg/m    CONSTITUTIONAL: Well-developed, well-nourished female in no acute distress.   PELVIC:  External Genitalia: Normal  Vagina: Normal, Swab collected   MUSCULOSKELETAL: Normal range of motion. No tenderness.  No cyanosis, clubbing, or edema.  Depression screen [facility] 2/9 01/01/2019 11/26/2018 11/01/2018 06/13/2018  Decreased Interest 1 0 3 0  Down, Depressed, Hopeless 1 0 3 0  PHQ - 2 Score 2 0 6 0  Altered sleeping 3 0 3 -  Tired, decreased energy 3 3 3  -  Change in appetite 3 0 3 -  Feeling bad or failure about yourself  3 0 3 -  Trouble concentrating 1 0 3 -  Moving slowly or fidgety/restless 0 0 3 -  Suicidal thoughts 0 0 3 -  PHQ-9 Score 15 3 27  -  Difficult doing work/chores Somewhat difficult Not difficult at all Somewhat difficult -   GAD 7 : Generalized Anxiety Score 01/01/2019 11/26/2018 11/01/2018  Nervous, Anxious, on Edge 0 0 3  Control/stop worrying 1 0 3  Worry too much - different things 1 0 0  Trouble relaxing 1 0 3  Restless 0 0 0  Easily annoyed or irritable 1 0 3  Afraid - awful might happen 3 0 3  Total GAD 7 Score 7 0 15  Anxiety Difficulty Somewhat difficult - Very difficult    Assessment:   1. Postpartum depression   2. Vaginal discharge   3. Request for sterilization  - Beta hCG quant (ref lab)  4. Amenorrhea due to oral contraceptive  - Beta hCG quant (ref lab)  5. Lactating mother  - Beta hCG quant (ref lab)   Plan:   Labs: see orders.   Rx: Zoloft 100 mg PO, see  orders  Copy of tubal consent completed 11/26/2018 given to patient.   Reviewed red flag symptoms and when  to call.   RTC for visit with Dr. Logan Bores to discuss outpatient bilateral tubal ligation.    Gunnar Bulla, CNM [facility] 01/01/19 3:49 PM

## 2019-01-01 NOTE — Patient Instructions (Signed)
Laparoscopic Tubal Ligation Laparoscopic tubal ligation is a procedure to close the fallopian tubes. This is done so that you cannot get pregnant. When the fallopian tubes are closed, the eggs that your ovaries release cannot enter the uterus, and sperm cannot reach the released eggs. You should not have this procedure if you want to get pregnant someday or if you are unsure about having more children. Tell a health care provider about:  Any allergies you have.  All medicines you are taking, including vitamins, herbs, eye drops, creams, and over-the-counter medicines.  Any problems you or family members have had with anesthetic medicines.  Any blood disorders you have.  Any surgeries you have had.  Any medical conditions you have.  Whether you are pregnant or may be pregnant.  Any past pregnancies. What are the risks? Generally, this is a safe procedure. However, problems may occur, including:  Infection.  Bleeding.  Injury to other organs in the abdomen.  Side effects from anesthetic medicines.  Failure of the procedure. This procedure can increase your risk of a kind of pregnancy in which a fertilized egg attaches to the outside of the uterus (ectopic pregnancy). What happens before the procedure? Medicines  Ask your health care provider about: ? Changing or stopping your regular medicines. This is especially important if you are taking diabetes medicines or blood thinners. ? Taking medicines such as aspirin and ibuprofen. These medicines can thin your blood. Do not take these medicines unless your health care provider tells you to take them. ? Taking over-the-counter medicines, vitamins, herbs, and supplements. Staying hydrated  Follow instructions from your health care provider about hydration, which may include: ? Up to 2 hours before the procedure - you may continue to drink clear liquids, such as water, clear fruit juice, black coffee, and plain tea. Eating and  drinking  Follow instructions from your health care provider about eating and drinking, which may include: ? 8 hours before the procedure - stop eating heavy meals or foods, such as meat, fried foods, or fatty foods. ? 6 hours before the procedure - stop eating light meals or foods, such as toast or cereal. ? 6 hours before the procedure - stop drinking milk or drinks that contain milk. ? 2 hours before the procedure - stop drinking clear liquids. General instructions  Do not use any products that contain nicotine or tobacco for at least 4 weeks before the procedure. These products include cigarettes, e-cigarettes, and chewing tobacco. If you need help quitting, ask your health care provider.  Plan to have someone take you home from the hospital.  If you will be going home right after the procedure, plan to have someone with you for 24 hours.  Ask your health care provider: ? How your surgery site will be marked. ? What steps will be taken to help prevent infection. These may include:  Removing hair at the surgery site.  Washing skin with a germ-killing soap.  Taking antibiotic medicine. What happens during the procedure?      An IV will be inserted into one of your veins.  You will be given one or more of the following: ? A medicine to help you relax (sedative). ? A medicine to numb the area (local anesthetic). ? A medicine to make you fall asleep (general anesthetic). ? A medicine that is injected into an area of your body to numb everything below the injection site (regional anesthetic).  Your bladder may be emptied with a small tube (  catheter).  If you have been given a general anesthetic, a tube will be put down your throat to help you breathe.  Two small incisions will be made in your lower abdomen and near your belly button.  Your abdomen will be inflated with a gas. This will let the surgeon see better and will give the surgeon room to work.  A thin, lighted tube  (laparoscope) with a camera attached will be inserted into your abdomen through one of the incisions. Small instruments will be inserted through the other incision.  The fallopian tubes will be tied off, burned (cauterized), or blocked with a clip, ring, or clamp. A small portion in the center of each fallopian tube may be removed.  The gas will be released from the abdomen.  The incisions will be closed with stitches (sutures).  A bandage (dressing) will be placed over the incisions. The procedure may vary among health care providers and hospitals. What happens after the procedure?  Your blood pressure, heart rate, breathing rate, and blood oxygen level will be monitored until you leave the hospital.  You will be given medicine to help with pain, nausea, and vomiting as needed. Summary  Laparoscopic tubal ligation is a procedure that is done so that you cannot get pregnant.  You should not have this procedure if you want to get pregnant someday or if you are unsure about having more children.  The procedure is done using a thin, lighted tube (laparoscope) with a camera attached that will be inserted into your abdomen through an incision.  Follow instructions from your health care provider about eating and drinking before the procedure. This information is not intended to replace advice given to you by your health care provider. Make sure you discuss any questions you have with your health care provider. Document Released: 07/04/2000 Document Revised: 02/20/2018 Document Reviewed: 02/20/2018 [employer] Patient Education  [place].    Sertraline tablets What is this medicine? SERTRALINE (SER tra leen) is used to treat depression. It may also be used to treat obsessive compulsive disorder, panic disorder, post-trauma stress, premenstrual dysphoric disorder (PMDD) or social anxiety. This medicine may be used for other purposes; ask your health care provider or pharmacist if you  have questions. COMMON BRAND NAME(S): Zoloft What should I tell my health care provider before I take this medicine? They need to know if you have any of these conditions:  bleeding disorders  bipolar disorder or a family history of bipolar disorder  glaucoma  heart disease  high blood pressure  history of irregular heartbeat  history of low levels of calcium, magnesium, or potassium in the blood  if you often drink alcohol  liver disease  receiving electroconvulsive therapy  seizures  suicidal thoughts, plans, or attempt; a previous suicide attempt by you or a family member  take medicines that treat or prevent blood clots  thyroid disease  an unusual or allergic reaction to sertraline, other medicines, foods, dyes, or preservatives  pregnant or trying to get pregnant  breast-feeding How should I use this medicine? Take this medicine by mouth with a glass of water. Follow the directions on the prescription label. You can take it with or without food. Take your medicine at regular intervals. Do not take your medicine more often than directed. Do not stop taking this medicine suddenly except upon the advice of your doctor. Stopping this medicine too quickly may cause serious side effects or your condition may worsen. A special MedGuide will be  given to you by the pharmacist with each prescription and refill. Be sure to read this information carefully each time. Talk to your pediatrician regarding the use of this medicine in children. While this drug may be prescribed for children as young as 7 years for selected conditions, precautions do apply. Overdosage: If you think you have taken too much of this medicine contact a poison control center or emergency room at once. NOTE: This medicine is only for you. Do not share this medicine with others. What if I miss a dose? If you miss a dose, take it as soon as you can. If it is almost time for your next dose, take only that  dose. Do not take double or extra doses. What may interact with this medicine? Do not take this medicine with any of the following medications:  cisapride  dronedarone  linezolid  MAOIs like Carbex, Eldepryl, Marplan, Nardil, and Parnate  methylene blue (injected into a vein)  pimozide  thioridazine This medicine may also interact with the following medications:  alcohol  amphetamines  aspirin and aspirin-like medicines  certain medicines for depression, anxiety, or psychotic disturbances  certain medicines for fungal infections like ketoconazole, fluconazole, posaconazole, and itraconazole  certain medicines for irregular heart beat like flecainide, quinidine, propafenone  certain medicines for migraine headaches like almotriptan, eletriptan, frovatriptan, naratriptan, rizatriptan, sumatriptan, zolmitriptan  certain medicines for sleep  certain medicines for seizures like carbamazepine, valproic acid, phenytoin  certain medicines that treat or prevent blood clots like warfarin, enoxaparin, dalteparin  cimetidine  digoxin  diuretics  fentanyl  isoniazid  lithium  NSAIDs, medicines for pain and inflammation, like ibuprofen or naproxen  other medicines that prolong the QT interval (cause an abnormal heart rhythm) like dofetilide  rasagiline  safinamide  supplements like St. John's wort, kava kava, valerian  tolbutamide  tramadol  tryptophan This list may not describe all possible interactions. Give your health care provider a list of all the medicines, herbs, non-prescription drugs, or dietary supplements you use. Also tell them if you smoke, drink alcohol, or use illegal drugs. Some items may interact with your medicine. What should I watch for while using this medicine? Tell your doctor if your symptoms do not get better or if they get worse. Visit your doctor or health care professional for regular checks on your progress. Because it may take  several weeks to see the full effects of this medicine, it is important to continue your treatment as prescribed by your doctor. Patients and their families should watch out for new or worsening thoughts of suicide or depression. Also watch out for sudden changes in feelings such as feeling anxious, agitated, panicky, irritable, hostile, aggressive, impulsive, severely restless, overly excited and hyperactive, or not being able to sleep. If this happens, especially at the beginning of treatment or after a change in dose, call your health care professional. Bonita Quin may get drowsy or dizzy. Do not drive, use machinery, or do anything that needs mental alertness until you know how this medicine affects you. Do not stand or sit up quickly, especially if you are an older patient. This reduces the risk of dizzy or fainting spells. Alcohol may interfere with the effect of this medicine. Avoid alcoholic drinks. Your mouth may get dry. Chewing sugarless gum or sucking hard candy, and drinking plenty of water may help. Contact your doctor if the problem does not go away or is severe. What side effects may I notice from receiving this medicine? Side effects that  you should report to your doctor or health care professional as soon as possible:  allergic reactions like skin rash, itching or hives, swelling of the face, lips, or tongue  anxious  black, tarry stools  changes in vision  confusion  elevated mood, decreased need for sleep, racing thoughts, impulsive behavior  eye pain  fast, irregular heartbeat  feeling faint or lightheaded, falls  feeling agitated, angry, or irritable  hallucination, loss of contact with reality  loss of balance or coordination  loss of memory  painful or prolonged erections  restlessness, pacing, inability to keep still  seizures  stiff muscles  suicidal thoughts or other mood changes  trouble sleeping  unusual bleeding or bruising  unusually weak or tired   vomiting Side effects that usually do not require medical attention (report to your doctor or health care professional if they continue or are bothersome):  change in appetite or weight  change in sex drive or performance  diarrhea  increased sweating  indigestion, nausea  tremors This list may not describe all possible side effects. Call your doctor for medical advice about side effects. You may report side effects to FDA at 1-800-FDA-1088. Where should I keep my medicine? Keep out of the reach of children. Store at room temperature between 15 and 30 degrees C (59 and 86 degrees F). Throw away any unused medicine after the expiration date. NOTE: This sheet is a summary. It may not cover all possible information. If you have questions about this medicine, talk to your doctor, pharmacist, or health care provider.  2020 Elsevier/[employer] Standard (2018-03-20 10:09:27)

## 2019-01-02 LAB — BETA HCG QUANT (REF LAB): hCG Quant: <1 m[IU]/mL

## 2019-01-03 ENCOUNTER — Encounter: Payer: Self-pay | Admitting: Certified Nurse Midwife

## 2019-01-03 LAB — CERVICOVAGINAL ANCILLARY ONLY
Bacterial Vaginitis (gardnerella): POSITIVE — AB
Candida Glabrata: NEGATIVE
Candida Vaginitis: NEGATIVE
Molecular Disclaimer: NEGATIVE
Molecular Disclaimer: NEGATIVE
Molecular Disclaimer: NEGATIVE
Molecular Disclaimer: NORMAL
Trichomonas: NEGATIVE

## 2019-02-26 ENCOUNTER — Encounter: Payer: Medicaid Other | Admitting: Certified Nurse Midwife

## 2019-03-01 ENCOUNTER — Other Ambulatory Visit: Payer: Self-pay

## 2019-03-01 ENCOUNTER — Encounter: Payer: Self-pay | Admitting: Certified Nurse Midwife

## 2019-03-01 ENCOUNTER — Ambulatory Visit (INDEPENDENT_AMBULATORY_CARE_PROVIDER_SITE_OTHER): Payer: Medicaid Other | Admitting: Certified Nurse Midwife

## 2019-03-01 VITALS — BP 119/74 | HR 57 | Ht 70.0 in | Wt 235.6 lb

## 2019-03-01 DIAGNOSIS — Z01419 Encounter for gynecological examination (general) (routine) without abnormal findings: Secondary | ICD-10-CM

## 2019-03-01 DIAGNOSIS — Z3041 Encounter for surveillance of contraceptive pills: Secondary | ICD-10-CM | POA: Diagnosis not present

## 2019-03-01 DIAGNOSIS — Z0000 Encounter for general adult medical examination without abnormal findings: Secondary | ICD-10-CM | POA: Diagnosis not present

## 2019-03-01 DIAGNOSIS — Z1331 Encounter for screening for depression: Secondary | ICD-10-CM

## 2019-03-01 MED ORDER — NORETHIN ACE-ETH ESTRAD-FE 1-20 MG-MCG PO TABS: 1.0000 | ORAL_TABLET | Freq: Every day | ORAL | 11 refills | Status: DC

## 2019-03-01 NOTE — Patient Instructions (Signed)
Ethinyl Estradiol; Norethindrone Acetate; Ferrous fumarate tablets or capsules What is this medicine? ETHINYL ESTRADIOL; NORETHINDRONE ACETATE; FERROUS FUMARATE (ETH in il es tra DYE ole; nor eth IN drone AS e tate; FER [place] FUE ma rate) is an oral contraceptive. The products combine two types of female hormones, an estrogen and a progestin. They are used to prevent ovulation and pregnancy. Some products are also used to treat acne in females. This medicine may be used for other purposes; ask your health care provider or pharmacist if you have questions. COMMON BRAND NAME(S): Aurovela [address] 1/20, Aurovela Fe, Blisovi [address] Fe, Estrostep Fe, Gildess 24 Fe, Gildess Fe 1.5/30, Gildess Fe 1/20, Hailey 24 Fe, Junel Fe 1.5/30, Junel Fe 1/20, Junel Fe 24, Larin Fe, Lo Loestrin Fe, Loestrin 24 Fe, Loestrin FE 1.5/30, Loestrin FE 1/20, Lomedia 24 Fe, Microgestin 24 Fe, Microgestin Fe 1.5/30, Microgestin Fe 1/20, Tarina 24 Fe, Tarina Fe 1/20, Taytulla, Tilia Fe, Tri-Legest Fe What should I tell my health care provider before I take this medicine? They need to know if you have any of these conditions:  abnormal vaginal bleeding  blood vessel disease  breast, cervical, endometrial, ovarian, liver, or uterine cancer  diabetes  gallbladder disease  heart disease or recent heart attack  high blood pressure  high cholesterol  history of blood clots  kidney disease  liver disease  migraine headaches  smoke tobacco  stroke  systemic lupus erythematosus (SLE)  an unusual or allergic reaction to estrogens, progestins, other medicines, foods, dyes, or preservatives  pregnant or trying to get pregnant  breast-feeding How should I use this medicine? Take this medicine by mouth. To reduce nausea, this medicine may be taken with food. Follow the directions on the prescription label. Take this medicine at the same time each day and in the order directed on the package. Do not take your  medicine more often than directed. A patient package insert for the product will be given with each prescription and refill. Read this sheet carefully each time. The sheet may change frequently. Contact your pediatrician regarding the use of this medicine in children. Special care may be needed. This medicine has been used in female children who have started having menstrual periods. Overdosage: If you think you have taken too much of this medicine contact a poison control center or emergency room at once. NOTE: This medicine is only for you. Do not share this medicine with others. What if I miss a dose? If you miss a dose, refer to the patient information sheet you received with your medicine for direction. If you miss more than one pill, this medicine may not be as effective and you may need to use another form of birth control. What may interact with this medicine? Do not take this medicine with the following medication:  dasabuvir; ombitasvir; paritaprevir; ritonavir  ombitasvir; paritaprevir; ritonavir This medicine may also interact with the following medications:  acetaminophen  antibiotics or medicines for infections, especially rifampin, rifabutin, rifapentine, and griseofulvin, and possibly penicillins or tetracyclines  aprepitant  ascorbic acid (vitamin C)  atorvastatin  barbiturate medicines, such as phenobarbital  bosentan  carbamazepine  caffeine  clofibrate  cyclosporine  dantrolene  doxercalciferol  felbamate  grapefruit juice  hydrocortisone  medicines for anxiety or sleeping problems, such as diazepam or temazepam  medicines for diabetes, including pioglitazone  mineral oil  modafinil  mycophenolate  nefazodone  oxcarbazepine  phenytoin  prednisolone  ritonavir or other medicines for HIV infection or  AIDS  rosuvastatin  selegiline  soy isoflavones supplements  St. John's wort  tamoxifen or  raloxifene  theophylline  thyroid hormones  topiramate  warfarin This list may not describe all possible interactions. Give your health care provider a list of all the medicines, herbs, non-prescription drugs, or dietary supplements you use. Also tell them if you smoke, drink alcohol, or use illegal drugs. Some items may interact with your medicine. What should I watch for while using this medicine? Visit your doctor or health care professional for regular checks on your progress. You will need a regular breast and pelvic exam and Pap smear while on this medicine. Use an additional method of contraception during the first cycle that you take these tablets. If you have any reason to think you are pregnant, stop taking this medicine right away and contact your doctor or health care professional. If you are taking this medicine for hormone related problems, it may take several cycles of use to see improvement in your condition. Smoking increases the risk of getting a blood clot or having a stroke while you are taking birth control pills, especially if you are more than 37 years old. You are strongly advised not to smoke. This medicine can make your body retain fluid, making your fingers, hands, or ankles swell. Your blood pressure can go up. Contact your doctor or health care professional if you feel you are retaining fluid. This medicine can make you more sensitive to the sun. Keep out of the sun. If you cannot avoid being in the sun, wear protective clothing and use sunscreen. Do not use sun lamps or tanning beds/booths. If you wear contact lenses and notice visual changes, or if the lenses begin to feel uncomfortable, consult your eye care specialist. In some women, tenderness, swelling, or minor bleeding of the gums may occur. Notify your dentist if this happens. Brushing and flossing your teeth regularly may help limit this. See your dentist regularly and inform your dentist of the medicines you  are taking. If you are going to have elective surgery, you may need to stop taking this medicine before the surgery. Consult your health care professional for advice. This medicine does not protect you against HIV infection (AIDS) or any other sexually transmitted diseases. What side effects may I notice from receiving this medicine? Side effects that you should report to your doctor or health care professional as soon as possible:  allergic reactions like skin rash, itching or hives, swelling of the face, lips, or tongue  breast tissue changes or discharge  changes in vaginal bleeding during your period or between your periods  changes in vision  chest pain  confusion  coughing up blood  dizziness  feeling faint or lightheaded  headaches or migraines  leg, arm or groin pain  loss of balance or coordination  severe or sudden headaches  stomach pain (severe)  sudden shortness of breath  sudden numbness or weakness of the face, arm or leg  symptoms of vaginal infection like itching, irritation or unusual discharge  tenderness in the upper abdomen  trouble speaking or understanding  vomiting  yellowing of the eyes or skin Side effects that usually do not require medical attention (report to your doctor or health care professional if they continue or are bothersome):  breakthrough bleeding and spotting that continues beyond the 3 initial cycles of pills  breast tenderness  mood changes, anxiety, depression, frustration, anger, or emotional outbursts  increased sensitivity to sun or ultraviolet light  nausea  skin rash, acne, or brown spots on the skin  weight gain (slight) This list may not describe all possible side effects. Call your doctor for medical advice about side effects. You may report side effects to FDA at 1-800-FDA-1088. Where should I keep my medicine? Keep out of the reach of children. Store at room temperature between 15 and 30 degrees C  (59 and 86 degrees F). Throw away any unused medicine after the expiration date. NOTE: This sheet is a summary. It may not cover all possible information. If you have questions about this medicine, talk to your doctor, pharmacist, or health care provider.  2020 Elsevier/Gold Standard (2015-12-07 08:04:41) Preventive Care 58-58 Years Old, Female Preventive care refers to visits with your health care provider and lifestyle choices that can promote health and wellness. This includes:  A yearly physical exam. This may also be called an annual well check.  Regular dental visits and eye exams.  Immunizations.  Screening for certain conditions.  Healthy lifestyle choices, such as eating a healthy diet, getting regular exercise, not using drugs or products that contain nicotine and tobacco, and limiting alcohol use. What can I expect for my preventive care visit? Physical exam Your health care provider will check your:  Height and weight. This may be used to calculate body mass index (BMI), which tells if you are at a healthy weight.  Heart rate and blood pressure.  Skin for abnormal spots. Counseling Your health care provider may ask you questions about your:  Alcohol, tobacco, and drug use.  Emotional well-being.  Home and relationship well-being.  Sexual activity.  Eating habits.  Work and work Statistician.  Method of birth control.  Menstrual cycle.  Pregnancy history. What immunizations do I need?  Influenza (flu) vaccine  This is recommended every year. Tetanus, diphtheria, and pertussis (Tdap) vaccine  You may need a Td booster every 10 years. Varicella (chickenpox) vaccine  You may need this if you have not been vaccinated. Human papillomavirus (HPV) vaccine  If recommended by your health care provider, you may need three doses over 6 months. Measles, mumps, and rubella (MMR) vaccine  You may need at least one dose of MMR. You may also need a second  dose. Meningococcal conjugate (MenACWY) vaccine  One dose is recommended if you are age 38-21 years and a first-year college student living in a residence hall, or if you have one of several medical conditions. You may also need additional booster doses. Pneumococcal conjugate (PCV13) vaccine  You may need this if you have certain conditions and were not previously vaccinated. Pneumococcal polysaccharide (PPSV23) vaccine  You may need one or two doses if you smoke cigarettes or if you have certain conditions. Hepatitis A vaccine  You may need this if you have certain conditions or if you travel or work in places where you may be exposed to hepatitis A. Hepatitis B vaccine  You may need this if you have certain conditions or if you travel or work in places where you may be exposed to hepatitis B. Haemophilus influenzae type b (Hib) vaccine  You may need this if you have certain conditions. You may receive vaccines as individual doses or as more than one vaccine together in one shot (combination vaccines). Talk with your health care provider about the risks and benefits of combination vaccines. What tests do I need?  Blood tests  Lipid and cholesterol levels. These may be checked every 5 years starting at age 54.  Hepatitis  C test.  Hepatitis B test. Screening  Diabetes screening. This is done by checking your blood sugar (glucose) after you have not eaten for a while (fasting).  Sexually transmitted disease (STD) testing.  BRCA-related cancer screening. This may be done if you have a family history of breast, ovarian, tubal, or peritoneal cancers.  Pelvic exam and Pap test. This may be done every 3 years starting at age 28. Starting at age 84, this may be done every 5 years if you have a Pap test in combination with an HPV test. Talk with your health care provider about your test results, treatment options, and if necessary, the need for more tests. Follow these instructions at  home: Eating and drinking   Eat a diet that includes fresh fruits and vegetables, whole grains, lean protein, and low-fat dairy.  Take vitamin and mineral supplements as recommended by your health care provider.  Do not drink alcohol if: ? Your health care provider tells you not to drink. ? You are pregnant, may be pregnant, or are planning to become pregnant.  If you drink alcohol: ? Limit how much you have to 0-1 drink a day. ? Be aware of how much alcohol is in your drink. In the [place] one drink equals one 12 oz bottle of beer (355 mL), one 5 oz glass of wine (148 mL), or one 1 oz glass of hard liquor (44 mL). Lifestyle  Take daily care of your teeth and gums.  Stay active. Exercise for at least 30 minutes on 5 or more days each week.  Do not use any products that contain nicotine or tobacco, such as cigarettes, e-cigarettes, and chewing tobacco. If you need help quitting, ask your health care provider.  If you are sexually active, practice safe sex. Use a condom or other form of birth control (contraception) in order to prevent pregnancy and STIs (sexually transmitted infections). If you plan to become pregnant, see your health care provider for a preconception visit. What's next?  Visit your health care provider once a year for a well check visit.  Ask your health care provider how often you should have your eyes and teeth checked.  Stay up to date on all vaccines. This information is not intended to replace advice given to you by your health care provider. Make sure you discuss any questions you have with your health care provider. Document Released: 05/24/2001 Document Revised: 12/07/2017 Document Reviewed: 12/07/2017 Elsevier Patient Education  2020 [employer].

## 2019-03-01 NOTE — Progress Notes (Addendum)
ANNUAL PREVENTATIVE CARE GYN  ENCOUNTER NOTE  Subjective:       Tasha Rodriguez is a 38 y.o. [phone] female here for a routine annual gynecologic exam.  Current complaints: 1. No longer breastfeeding-needs regular OCP  Denies difficulty breathing or respiratory distress, chest pain, abdominal pain, excessive vaginal bleeding, dysuria, and leg pain or swelling.    Gynecologic History  Patient's last menstrual period was 02/27/2019 (exact date). Period Cycle (Days): 28 Period Duration (Days): 5 Period Pattern: Regular Menstrual Flow: Heavy, Light Menstrual Control: Other (Comment), Maxi pad(Diva cup) Dysmenorrhea: (!) Severe Dysmenorrhea Symptoms: Cramping  Contraception: oral progesterone-only contraceptive, Slynd  Last Pap: 10/2016. Results were: Negative/Negative  Obstetric History  OB History  Gravida Para Term Preterm AB Living  4 3 3   1 3   SAB TAB Ectopic Multiple Live Births  1     0 3    # Outcome Date GA Lbr Len/2nd Weight Sex Delivery Anes PTL Lv  4 Term 10/14/18 [id number]  4 lb 6.9 oz (2.01 kg) F CS-LTranv   LIV  3 SAB 2018 [id number]         2 Term 11/11/09   7 lb 5 oz (3.317 kg) F CS-Unspec  N LIV  1 Term 11/12/99   7 lb 9.6 oz (3.447 kg) F CS-Unspec EPI  LIV     Complications: Failure to Progress in First Stage, Fetal Intolerance    Past Medical History:  Diagnosis Date  . Cervical dysplasia 2010  . Diabetes mellitus without complication (HCC)   . HPV (human papilloma virus) infection     Past Surgical History:  Procedure Laterality Date  . CESAREAN SECTION    . CESAREAN SECTION N/A 10/14/2018   Procedure: CESAREAN SECTION;  Surgeon: Linzie CollinEvans, David James, MD;  Location: [facility] ORS;  Service: Obstetrics;  Laterality: N/A;  . LEEP  2010    Current Outpatient Medications on File Prior to Visit  Medication Sig Dispense Refill  . cholecalciferol (VITAMIN D) 1000 units tablet Take 1,000 Units by mouth daily.    . Drospirenone (SLYND) 4 MG TABS Take by mouth.     . Prenatal Vit-Fe Fumarate-FA (PRENATAL MULTIVITAMIN) TABS tablet Take 1 tablet by mouth daily at 12 noon.     No current facility-administered medications on file prior to visit.     No Known Allergies  Social History   Socioeconomic History  . Marital status: Single    Spouse name: Not on file  . Number of children: Not on file  . Years of education: Not on file  . Highest education level: Not on file  Occupational History  . Not on file  Social Needs  . Financial resource strain: Not on file  . Food insecurity    Worry: Not on file    Inability: Not on file  . Transportation needs    Medical: Not on file    Non-medical: Not on file  Tobacco Use  . Smoking status: Never Smoker  . Smokeless tobacco: Never Used  Substance and Sexual Activity  . Alcohol use: Not Currently  . Drug use: No  . Sexual activity: Yes    Birth control/protection: Pill  Lifestyle  . Physical activity    Days per week: Not on file    Minutes per session: Not on file  . Stress: Not on file  Relationships  . Social Musicianconnections    Talks on phone: Not on file    Gets together: Not on file  Attends religious service: Not on file    Active member of club or organization: Not on file    Attends meetings of clubs or organizations: Not on file    Relationship status: Not on file  . Intimate partner violence    Fear of current or ex partner: Not on file    Emotionally abused: Not on file    Physically abused: Not on file    Forced sexual activity: Not on file  Other Topics Concern  . Not on file  Social History Narrative  . Not on file    Family History  Problem Relation Age of Onset  . Diabetes Mother   . Ovarian cancer Mother 51       s/p hyst, no chemo/rad; question cx  . Diabetes Father   . Liver disease Brother   . Diabetes Maternal Aunt   . Diabetes Maternal Uncle   . Diabetes Maternal Grandmother   . Diabetes Maternal Grandfather   . Breast cancer Neg Hx   . Colon cancer  Neg Hx     The following portions of the patient's history were reviewed and updated as appropriate: allergies, current medications, past family history, past medical history, past social history, past surgical history and problem list.  Review of Systems  ROS negative except as noted above. Information obtained from patient.    Objective:   BP 119/74   Pulse (!) 57   Ht 5\' 10"  (1.778 m)   Wt 235 lb 9.6 oz (106.9 kg)   LMP 02/27/2019 (Exact Date)   Breastfeeding No   BMI 33.81 kg/m    CONSTITUTIONAL: Well-developed, well-nourished female in no acute distress.   PSYCHIATRIC: Normal mood and affect. Normal behavior. Normal judgment and thought content.  [place]: Alert and oriented to person, place, and time. Normal muscle tone coordination. No cranial nerve deficit noted.  HENT:  Normocephalic, atraumatic, External right and left ear normal.   EYES: Conjunctivae and EOM are normal. Pupils are equal and round.   NECK: Normal range of motion, supple, no masses.  Normal thyroid.   SKIN: Skin is warm and dry. No rash noted. Not diaphoretic. No erythema. No pallor. Professional tattoos present.   CARDIOVASCULAR: Normal heart rate noted, regular rhythm, no murmur.  RESPIRATORY: Clear to auscultation bilaterally. Effort and breath sounds normal, no problems with respiration noted.  BREASTS: Symmetric in size. No masses, skin changes, nipple drainage, or lymphadenopathy. Bilateral inverted nipples, evert with effort.   ABDOMEN: Soft, normal bowel sounds, no distention noted.  No tenderness, rebound or guarding. Obese.   PELVIC:  External Genitalia: Normal  Vagina: Normal  Cervix: Normal  Uterus: Normal  Adnexa: Normal  MUSCULOSKELETAL: Normal range of motion. No tenderness.  No cyanosis, clubbing, or edema.  2+ distal pulses.  LYMPHATIC: No Axillary, Supraclavicular, or Inguinal Adenopathy.  Depression screen [facility] 2/9 03/01/2019 01/01/2019 11/26/2018 11/01/2018 06/13/2018   Decreased Interest 0 1 0 3 0  Down, Depressed, Hopeless 1 1 0 3 0  PHQ - 2 Score 1 2 0 6 0  Altered sleeping 1 3 0 3 -  Tired, decreased energy 1 3 3 3  -  Change in appetite 1 3 0 3 -  Feeling bad or failure about yourself  0 3 0 3 -  Trouble concentrating 1 1 0 3 -  Moving slowly or fidgety/restless 0 0 0 3 -  Suicidal thoughts 0 0 0 3 -  PHQ-9 Score 5 15 3 27  -  Difficult doing work/chores Not  difficult at all Somewhat difficult Not difficult at all Somewhat difficult -   GAD 7 : Generalized Anxiety Score 03/01/2019 01/01/2019 11/26/2018 11/01/2018  Nervous, Anxious, on Edge 1 0 0 3  Control/stop worrying 1 1 0 3  Worry too much - different things 0 1 0 0  Trouble relaxing 0 1 0 3  Restless 0 0 0 0  Easily annoyed or irritable 0 1 0 3  Afraid - awful might happen 0 3 0 3  Total GAD 7 Score 2 7 0 15  Anxiety Difficulty Not difficult at all Somewhat difficult - Very difficult    Assessment:   Annual gynecologic examination 38 y.o.   Contraception: OCP (estrogen/progesterone), Junel   Obesity 1   Problem List Items Addressed This Visit    None    Visit Diagnoses    Well woman exam    -  Primary   Depression screen       Encounter for surveillance of contraceptive pills          Plan:   Pap: Not needed  Labs: Declines   Routine preventative health maintenance measures emphasized: Exercise/Diet/Weight control, Tobacco Warnings, Alcohol/Substance use risks, Stress Management and Peer Pressure Issues; see AVS  Rx: Junel, see orders  Reviewed red flag symptoms and when to call  RTC for Pre-Op for BTL with MD  RTC x 1 year for ANNUAL EXAM or sooner if needed   Gunnar Bulla, CNM [facility], [facility] 03/01/19 3:12 PM

## 2019-03-01 NOTE — Progress Notes (Signed)
Patient here for annual exam, no complaints.  

## 2019-11-30 IMAGING — US ULTRAOUND FETAL BPP W/O NONSTRESS
1 series · 14 of 20 positions shown · non-contrast
Comparison: none

CLINICAL DATA: 38-year-old female with nonreassuring fetal monitor
tracing.

EXAM:
BIOPHYSICAL PROFILE

[Series 1: ultraound fetal bpp w/o nonstress · 20 acquisitions, 14 frames shown]
[im 1/20]
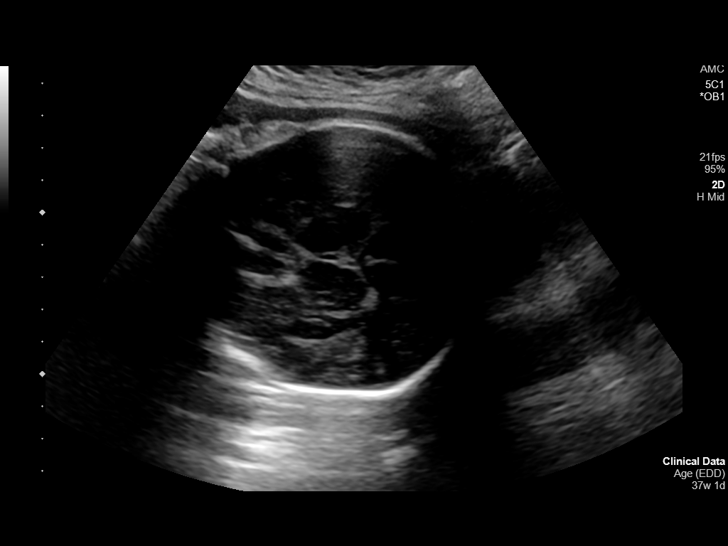
[im 3/20]
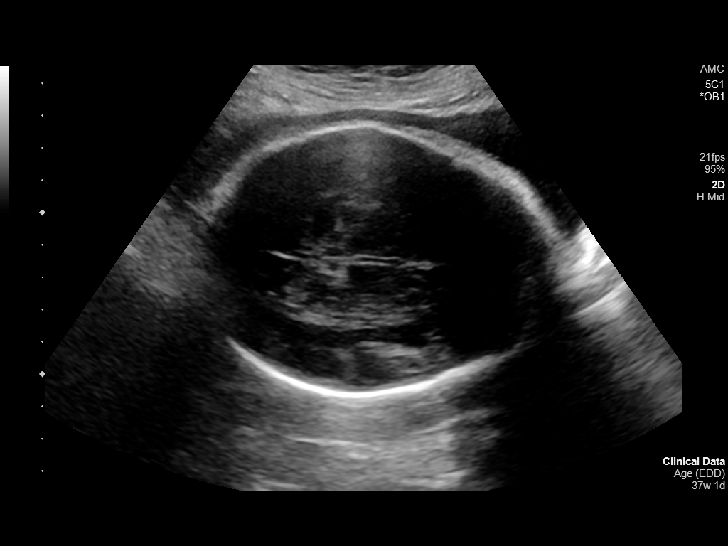
[im 4/20]
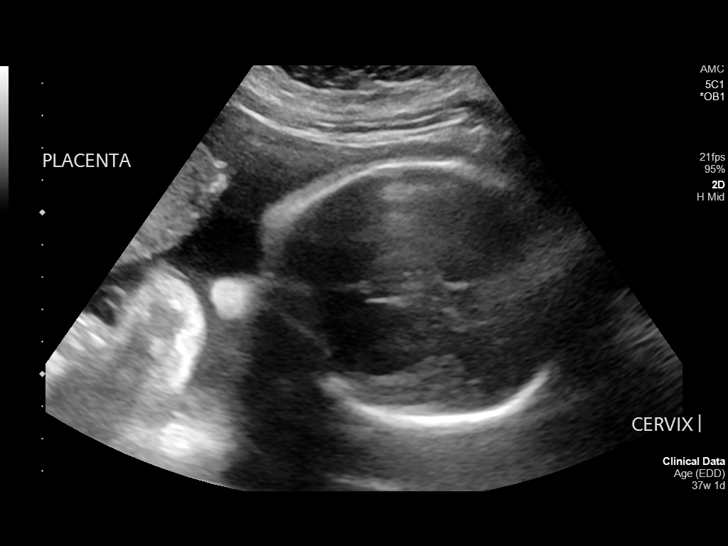
[im 6/20]
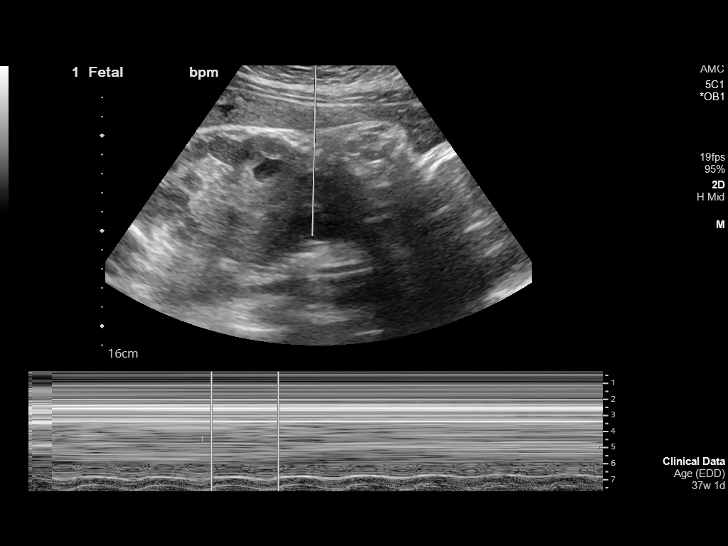
[im 7/20]
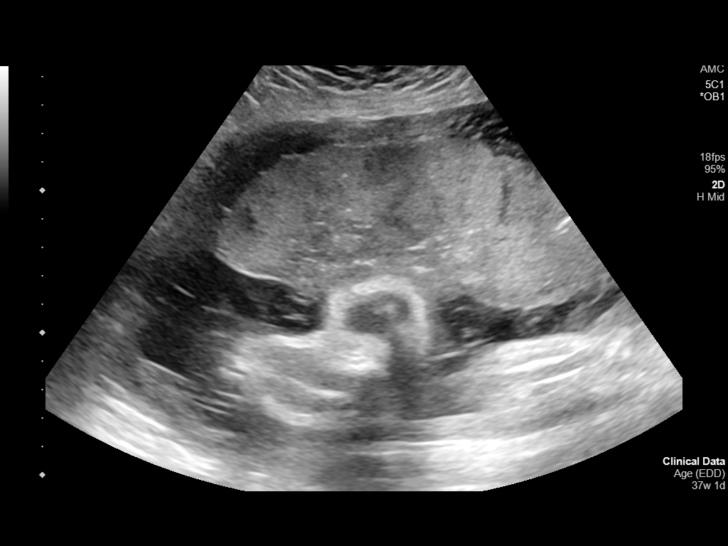
[im 8/20]
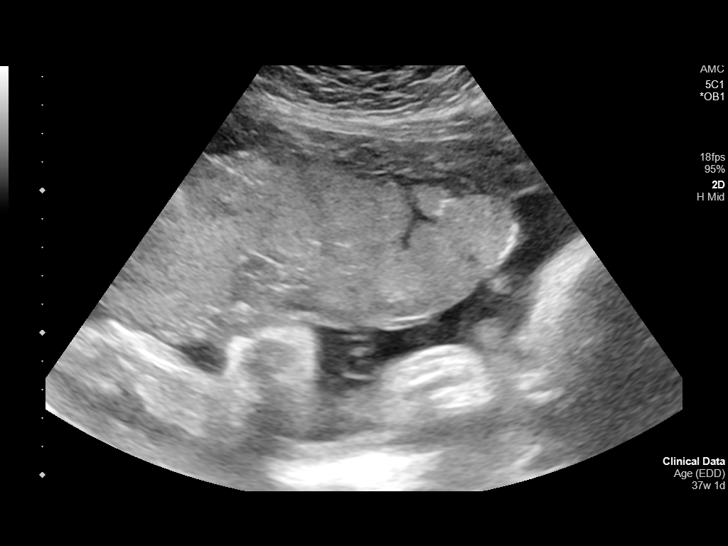
[im 10/20]
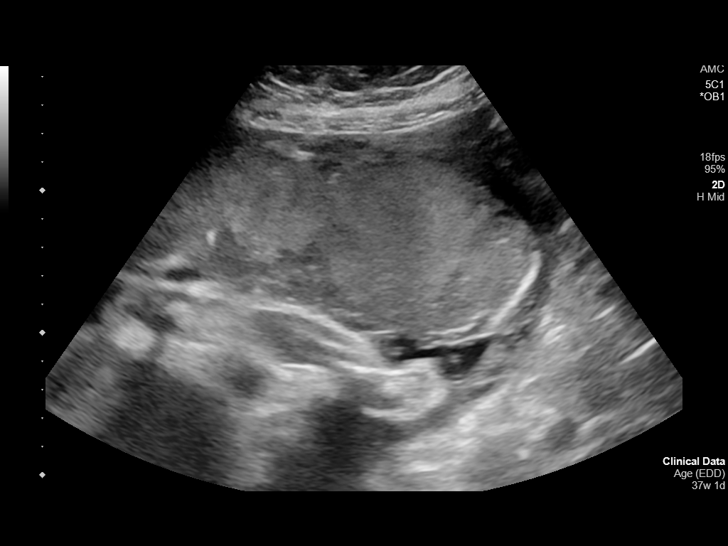
[im 11/20]
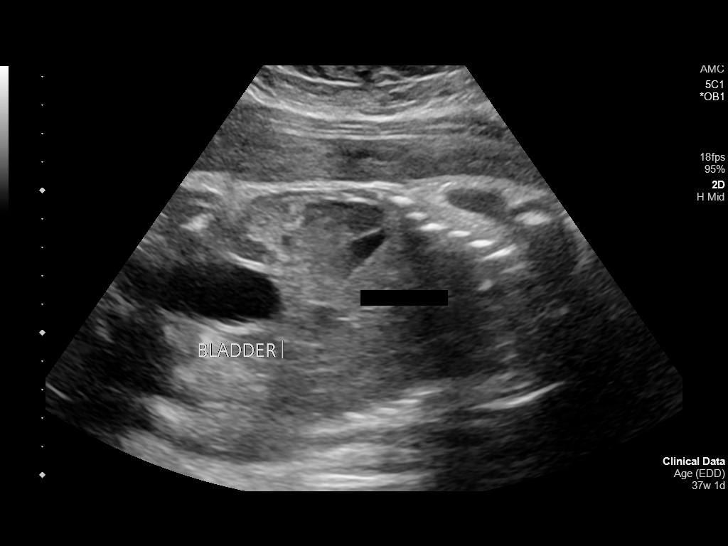
[im 13/20]
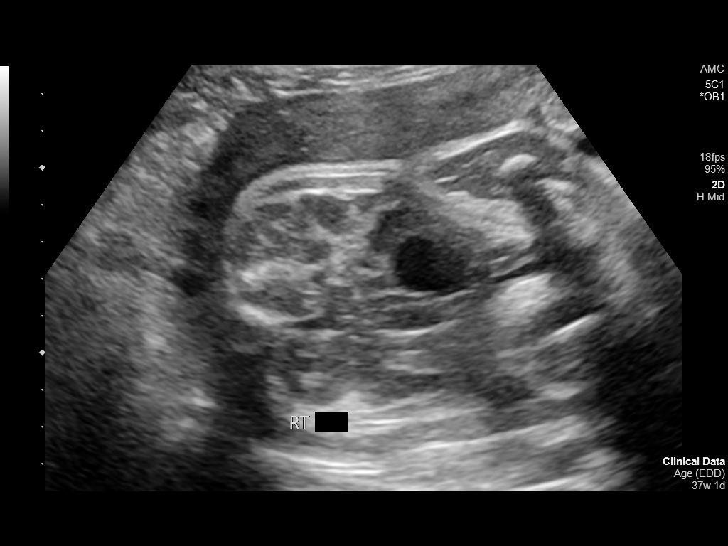
[im 14/20]
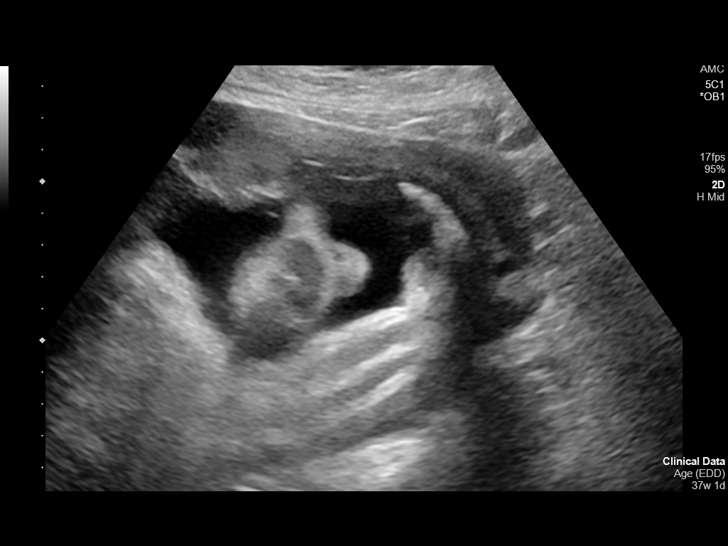
[im 16/20]
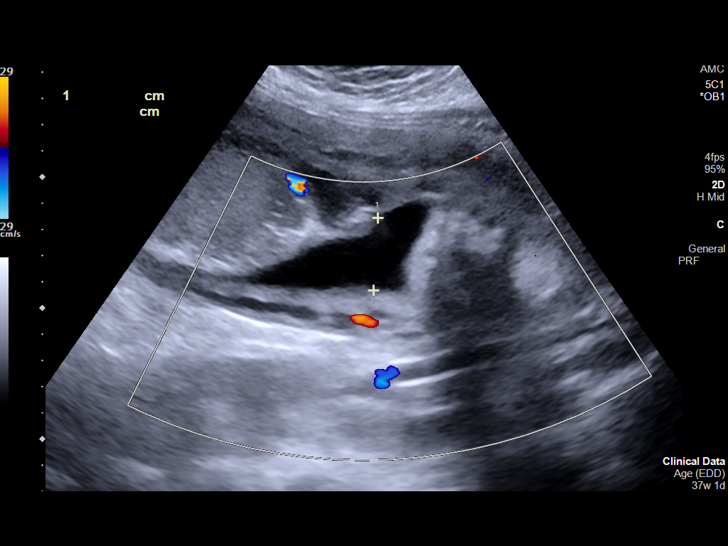
[im 17/20]
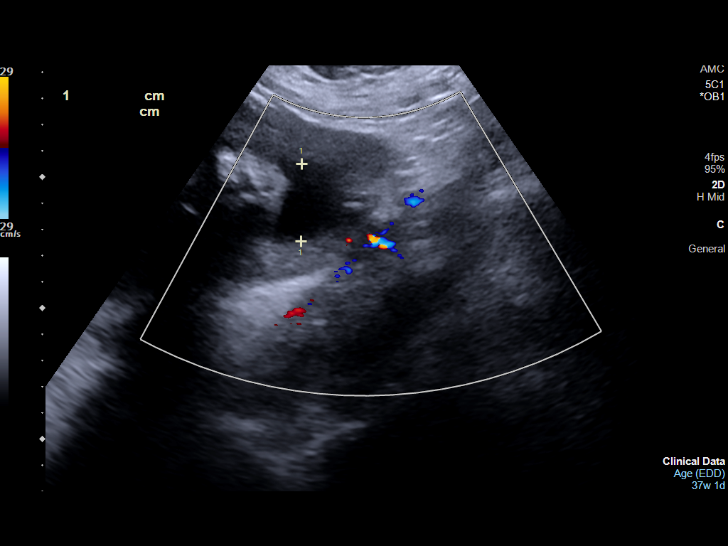
[im 18/20]
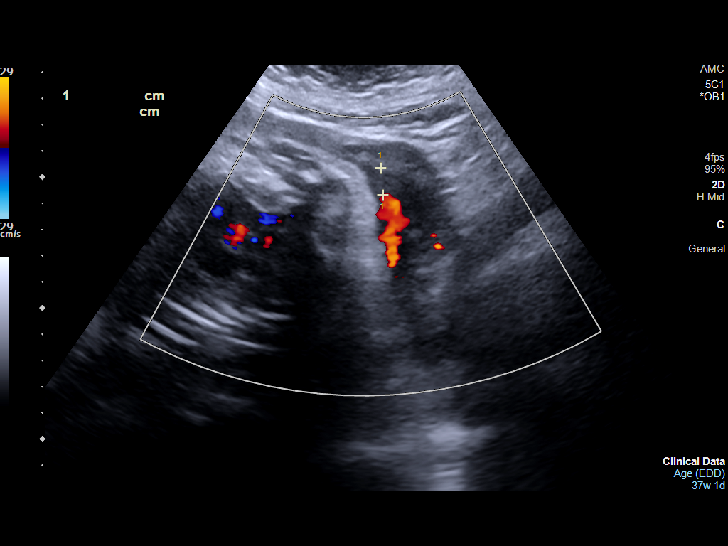
[im 20/20]
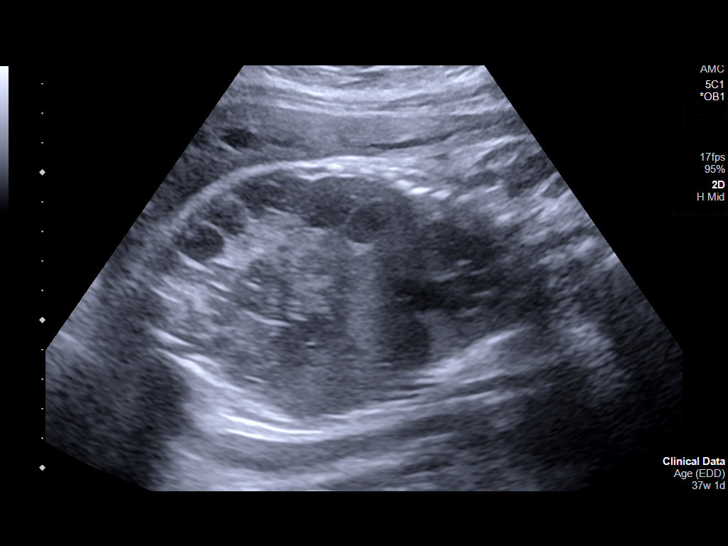

[14 of 20 positions shown; findings below may reference images not displayed]

FINDINGS: Number of Fetuses: 1

Heart Rate: 153 bpm

Presentation: Cephalic

Movement: 2 time: 10 minutes

Breathing: 2

Tone: 2

Amniotic Fluid: 2

Total Score: 8
IMPRESSION: Biophysical profile score 8

## 2020-09-17 ENCOUNTER — Encounter: Payer: Medicaid Other | Admitting: Certified Nurse Midwife

## 2020-10-09 ENCOUNTER — Other Ambulatory Visit: Payer: Self-pay

## 2020-10-09 ENCOUNTER — Encounter: Payer: Self-pay | Admitting: Certified Nurse Midwife

## 2020-10-09 ENCOUNTER — Ambulatory Visit (INDEPENDENT_AMBULATORY_CARE_PROVIDER_SITE_OTHER): Payer: Medicaid Other | Admitting: Certified Nurse Midwife

## 2020-10-09 ENCOUNTER — Other Ambulatory Visit (HOSPITAL_COMMUNITY)
Admission: RE | Admit: 2020-10-09 | Discharge: 2020-10-09 | Disposition: A | Discharge status: Home / Self Care | Payer: Medicaid Other | Source: Ambulatory Visit | Attending: Certified Nurse Midwife | Admitting: Certified Nurse Midwife

## 2020-10-09 VITALS — BP 134/83 | HR 71 | Resp 16 | Ht 70.0 in | Wt 245.2 lb

## 2020-10-09 DIAGNOSIS — Z1329 Encounter for screening for other suspected endocrine disorder: Secondary | ICD-10-CM

## 2020-10-09 DIAGNOSIS — Z01419 Encounter for gynecological examination (general) (routine) without abnormal findings: Secondary | ICD-10-CM | POA: Diagnosis not present

## 2020-10-09 DIAGNOSIS — Z124 Encounter for screening for malignant neoplasm of cervix: Secondary | ICD-10-CM

## 2020-10-09 DIAGNOSIS — Z131 Encounter for screening for diabetes mellitus: Secondary | ICD-10-CM

## 2020-10-09 DIAGNOSIS — Z113 Encounter for screening for infections with a predominantly sexual mode of transmission: Secondary | ICD-10-CM | POA: Diagnosis not present

## 2020-10-09 DIAGNOSIS — Z6835 Body mass index (BMI) 35.0-35.9, adult: Secondary | ICD-10-CM

## 2020-10-09 DIAGNOSIS — Z3041 Encounter for surveillance of contraceptive pills: Secondary | ICD-10-CM

## 2020-10-09 DIAGNOSIS — Z13220 Encounter for screening for lipoid disorders: Secondary | ICD-10-CM

## 2020-10-09 NOTE — Progress Notes (Signed)
ANNUAL PREVENTATIVE CARE GYN  ENCOUNTER NOTE  Subjective:       Tasha Rodriguez is a 40 y.o. [phone] female here for a routine annual gynecologic exam.  Current complaints: 1. Needs Pap smear-history of abnormal 2. Requests ANNUAL LABS due to history of GDM  Mother passed away two (2) months ago. Youngest child unable to walk and undergoing evaluation.   Denies difficulty breathing or respiratory distress, chest pain, abdominal pain, excessive vaginal bleeding, dysuria, and leg pain or swelling.    Gynecologic History  Patient's last menstrual period was 09/16/2020 (exact date). Period Cycle (Days): 28 Period Duration (Days): 4 Period Pattern: Regular Menstrual Flow: Light, Moderate, Heavy Menstrual Control: Maxi pad Menstrual Control Change Freq (Hours): 2 Dysmenorrhea: None  Contraception: abstinence  Last Pap: 10/2016. Results were: Neg/Neg  Obstetric History  OB History  Gravida Para Term Preterm AB Living  4 3 3   1 3   SAB IAB Ectopic Multiple Live Births  1     0 3    # Outcome Date GA Lbr Len/2nd Weight Sex Delivery Anes PTL Lv  4 Term 10/14/18 [redacted]w[redacted]d  4 lb 6.9 oz (2.01 kg) F CS-LTranv   LIV  3 SAB 2018 [redacted]w[redacted]d         2 Term 11/11/09   7 lb 5 oz (3.317 kg) F CS-Unspec  N LIV  1 Term 11/12/99   7 lb 9.6 oz (3.447 kg) F CS-Unspec EPI  LIV     Complications: Failure to Progress in First Stage, Fetal Intolerance    Past Medical History:  Diagnosis Date   Cervical dysplasia 2010   Diabetes mellitus without complication (HCC)    HPV (human papilloma virus) infection     Past Surgical History:  Procedure Laterality Date   CESAREAN SECTION     CESAREAN SECTION N/A 10/14/2018   Procedure: CESAREAN SECTION;  Surgeon: 12/15/2018, MD;  Location: [facility] ORS;  Service: Obstetrics;  Laterality: N/A;   LEEP  2010    Current Outpatient Medications on File Prior to Visit  Medication Sig Dispense Refill   cholecalciferol (VITAMIN D) 1000 units tablet Take 1,000  Units by mouth daily.     norethindrone-ethinyl estradiol (JUNEL FE 1/20) 1-20 MG-MCG tablet Take 1 tablet by mouth daily. 1 Package 11   Prenatal Vit-Fe Fumarate-FA (PRENATAL MULTIVITAMIN) TABS tablet Take 1 tablet by mouth daily at 12 noon.     No current facility-administered medications on file prior to visit.    No Known Allergies  Social History   Socioeconomic History   Marital status: Single    Spouse name: Not on file   Number of children: Not on file   Years of education: Not on file   Highest education level: Not on file  Occupational History   Not on file  Tobacco Use   Smoking status: Never   Smokeless tobacco: Never  Vaping Use   Vaping Use: Never used  Substance and Sexual Activity   Alcohol use: Not Currently   Drug use: No   Sexual activity: Yes    Birth control/protection: Pill  Other Topics Concern   Not on file  Social History Narrative   Not on file   Social Determinants of Health   Financial Resource Strain: Not on file  Food Insecurity: Not on file  Transportation Needs: Not on file  Physical Activity: Not on file  Stress: Not on file  Social Connections: Not on file  Intimate Partner Violence: Not on  file    Family History  Problem Relation Age of Onset   Diabetes Mother    Ovarian cancer Mother 64       s/p hyst, no chemo/rad; question cx   Diabetes Father    Liver disease Brother    Diabetes Maternal Aunt    Diabetes Maternal Uncle    Diabetes Maternal Grandmother    Diabetes Maternal Grandfather    Breast cancer Neg Hx    Colon cancer Neg Hx     The following portions of the patient's history were reviewed and updated as appropriate: allergies, current medications, past family history, past medical history, past social history, past surgical history and problem list.  Review of Systems  ROS negative except as noted above. Information obtained from patient.    Objective:   BP 134/83   Pulse 71   Resp 16   Ht 5\' 10"   (1.778 m)   Wt 245 lb 3.2 oz (111.2 kg)   LMP 09/16/2020 (Exact Date)   BMI 35.18 kg/m   CONSTITUTIONAL: Well-developed, well-nourished female in no acute distress.   PSYCHIATRIC: Normal mood and affect. Normal behavior. Normal judgment and thought content.  NEUROLGIC: Alert and oriented to person, place, and time. Normal muscle tone coordination. No cranial nerve deficit noted.  HENT:  Normocephalic, atraumatic.  EYES: Conjunctivae and EOM are normal.   NECK: Normal range of motion, supple, no masses.  Normal thyroid.   SKIN: Skin is warm and dry. No rash noted. Not diaphoretic. No erythema. No pallor.  CARDIOVASCULAR: Normal heart rate noted, regular rhythm, no murmur.  RESPIRATORY: Clear to auscultation bilaterally. Effort and breath sounds normal, no problems with respiration noted.  BREASTS: Symmetric in size. No masses, skin changes, nipple drainage, or lymphadenopathy. Inverted nipples.   ABDOMEN: Soft, normal bowel sounds, no distention noted.  No tenderness, rebound or guarding.   PELVIC:  External Genitalia: Normal  Vagina: Normal  Cervix: Normal, Pap collected  Uterus: Normal  Adnexa: Normal   MUSCULOSKELETAL: Normal range of motion. No tenderness.  No cyanosis, clubbing, or edema.  2+ distal pulses.  LYMPHATIC: No Axillary, Supraclavicular, or Inguinal Adenopathy.  Depression screen [place] 2/9 10/09/2020 03/01/2019 01/01/2019 11/26/2018 11/01/2018  Decreased Interest 1 0 1 0 3  Down, Depressed, Hopeless 1 1 1  0 3  PHQ - 2 Score 2 1 2  0 6  Altered sleeping 1 1 3  0 3  Tired, decreased energy 1 1 3 3 3   Change in appetite 1 1 3  0 3  Feeling bad or failure about yourself  1 0 3 0 3  Trouble concentrating 1 1 1  0 3  Moving slowly or fidgety/restless 0 0 0 0 3  Suicidal thoughts 0 0 0 0 3  PHQ-9 Score 7 5 15 3 27   Difficult doing work/chores - Not difficult at all Somewhat difficult Not difficult at all Somewhat difficult    GAD 7 : Generalized Anxiety Score  10/09/2020 03/01/2019 01/01/2019 11/26/2018  Nervous, Anxious, on Edge 1 1 0 0  Control/stop worrying 1 1 1  0  Worry too much - different things 1 0 1 0  Trouble relaxing 1 0 1 0  Restless 0 0 0 0  Easily annoyed or irritable 1 0 1 0  Afraid - awful might happen 1 0 3 0  Total GAD 7 Score 6 2 7  0  Anxiety Difficulty Somewhat difficult Not difficult at all Somewhat difficult -    Assessment:   Annual gynecologic examination 40 y.o.  Contraception: abstinence  Obesity 2  Problem List Items Addressed This Visit   None Visit Diagnoses     Well woman exam    -  Primary   Relevant Orders   Cytology - PAP   Hemoglobin A1c   Lipid panel   TSH   Screening for cervical cancer       Relevant Orders   Cytology - PAP   Encounter for surveillance of contraceptive pills       Routine screening for STI (sexually transmitted infection)       Relevant Orders   Cytology - PAP   BMI 35.0-35.9,adult       Relevant Orders   Hemoglobin A1c   Lipid panel   TSH   Screening cholesterol level       Relevant Orders   Lipid panel   Screening for diabetes mellitus       Relevant Orders   Hemoglobin A1c   Screening for thyroid disorder       Relevant Orders   TSH       Plan:   Pap: Pap Co Test and GC/CT NAAT  Mammogram: Not Indicated until next year  Labs: See orders  Routine preventative health maintenance measures emphasized: Exercise/Diet/Weight control, Tobacco Warnings, Alcohol/Substance use risks, and Stress Management; see AVS  Referral to  Return to Clinic - 1 Year   Serafina Royals, CNM  [facility], [facility] 10/09/20 3:21 PM

## 2020-10-09 NOTE — Patient Instructions (Signed)
Preventive Care 21-39 Years Old, Female Preventive care refers to lifestyle choices and visits with your health care provider that can promote health and wellness. This includes: A yearly physical exam. This is also called an annual wellness visit. Regular dental and eye exams. Immunizations. Screening for certain conditions. Healthy lifestyle choices, such as: Eating a healthy diet. Getting regular exercise. Not using drugs or products that contain nicotine and tobacco. Limiting alcohol use. What can I expect for my preventive care visit? Physical exam Your health care provider may check your: Height and weight. These may be used to calculate your BMI (body mass index). BMI is a measurement that tells if you are at a healthy weight. Heart rate and blood pressure. Body temperature. Skin for abnormal spots. Counseling Your health care provider may ask you questions about your: Past medical problems. Family's medical history. Alcohol, tobacco, and drug use. Emotional well-being. Home life and relationship well-being. Sexual activity. Diet, exercise, and sleep habits. Work and work environment. Access to firearms. Method of birth control. Menstrual cycle. Pregnancy history. What immunizations do I need?  Vaccines are usually given at various ages, according to a schedule. Your health care provider will recommend vaccines for you based on your age, medicalhistory, and lifestyle or other factors, such as travel or where you work. What tests do I need?  Blood tests Lipid and cholesterol levels. These may be checked every 5 years starting at age 20. Hepatitis C test. Hepatitis B test. Screening Diabetes screening. This is done by checking your blood sugar (glucose) after you have not eaten for a while (fasting). STD (sexually transmitted disease) testing, if you are at risk. BRCA-related cancer screening. This may be done if you have a family history of breast, ovarian, tubal, or  peritoneal cancers. Pelvic exam and Pap test. This may be done every 3 years starting at age 21. Starting at age 30, this may be done every 5 years if you have a Pap test in combination with an HPV test. Talk with your health care provider about your test results, treatment options,and if necessary, the need for more tests. Follow these instructions at home: Eating and drinking  Eat a healthy diet that includes fresh fruits and vegetables, whole grains, lean protein, and low-fat dairy products. Take vitamin and mineral supplements as recommended by your health care provider. Do not drink alcohol if: Your health care provider tells you not to drink. You are pregnant, may be pregnant, or are planning to become pregnant. If you drink alcohol: Limit how much you have to 0-1 drink a day. Be aware of how much alcohol is in your drink. In the [place] one drink equals one 12 oz bottle of beer (355 mL), one 5 oz glass of wine (148 mL), or one 1 oz glass of hard liquor (44 mL).  Lifestyle Take daily care of your teeth and gums. Brush your teeth every morning and night with fluoride toothpaste. Floss one time each day. Stay active. Exercise for at least 30 minutes 5 or more days each week. Do not use any products that contain nicotine or tobacco, such as cigarettes, e-cigarettes, and chewing tobacco. If you need help quitting, ask your health care provider. Do not use drugs. If you are sexually active, practice safe sex. Use a condom or other form of protection to prevent STIs (sexually transmitted infections). If you do not wish to become pregnant, use a form of birth control. If you plan to become pregnant, see your health care   provider for a prepregnancy visit. Find healthy ways to cope with stress, such as: Meditation, yoga, or listening to music. Journaling. Talking to a trusted person. Spending time with friends and family. Safety Always wear your seat belt while driving or riding in a  vehicle. Do not drive: If you have been drinking alcohol. Do not ride with someone who has been drinking. When you are tired or distracted. While texting. Wear a helmet and other protective equipment during sports activities. If you have firearms in your house, make sure you follow all gun safety procedures. Seek help if you have been physically or sexually abused. What's next? Go to your health care provider once a year for an annual wellness visit. Ask your health care provider how often you should have your eyes and teeth checked. Stay up to date on all vaccines. This information is not intended to replace advice given to you by your health care provider. Make sure you discuss any questions you have with your healthcare provider. Document Revised: 11/24/2019 Document Reviewed: 12/07/2017 Elsevier Patient Education  2022 [employer].

## 2020-10-10 LAB — LIPID PANEL
Chol/HDL Ratio: 3 ratio (ref 0.0–4.4)
Cholesterol, Total: 117 mg/dL (ref 100–199)
HDL: 39 mg/dL — ABNORMAL LOW (ref >39)
LDL Chol Calc (NIH): 55 mg/dL (ref 0–99)
Triglycerides: 127 mg/dL (ref 0–149)
VLDL Cholesterol Cal: 23 mg/dL (ref 5–40)

## 2020-10-10 LAB — TSH: TSH: 0.835 u[IU]/mL (ref 0.450–4.500)

## 2020-10-10 LAB — HEMOGLOBIN A1C
Est. average glucose Bld gHb Est-mCnc: 123 mg/dL
Hgb A1c MFr Bld: 5.9 % — ABNORMAL HIGH (ref 4.8–5.6)

## 2020-10-14 LAB — CYTOLOGY - PAP
Chlamydia: NEGATIVE
Comment: NEGATIVE
Comment: NEGATIVE
Comment: NORMAL
Diagnosis: NEGATIVE
High risk HPV: NEGATIVE
Neisseria Gonorrhea: NEGATIVE

## 2022-01-21 ENCOUNTER — Encounter: Payer: Self-pay | Admitting: Obstetrics and Gynecology

## 2022-01-24 ENCOUNTER — Encounter: Payer: Self-pay | Admitting: Certified Nurse Midwife

## 2022-09-13 ENCOUNTER — Other Ambulatory Visit: Payer: Self-pay | Admitting: Certified Nurse Midwife

## 2022-09-13 DIAGNOSIS — Z1231 Encounter for screening mammogram for malignant neoplasm of breast: Secondary | ICD-10-CM

## 2022-09-16 ENCOUNTER — Ambulatory Visit (INDEPENDENT_AMBULATORY_CARE_PROVIDER_SITE_OTHER): Payer: Medicaid Other | Admitting: Licensed Practical Nurse

## 2022-09-16 ENCOUNTER — Encounter: Payer: Self-pay | Admitting: Licensed Practical Nurse

## 2022-09-16 ENCOUNTER — Other Ambulatory Visit (HOSPITAL_COMMUNITY)
Admission: RE | Admit: 2022-09-16 | Discharge: 2022-09-16 | Disposition: A | Discharge status: Home / Self Care | Payer: Medicaid Other | Source: Ambulatory Visit | Attending: Licensed Practical Nurse | Admitting: Licensed Practical Nurse

## 2022-09-16 VITALS — BP 127/70 | HR 73 | Wt 238.0 lb

## 2022-09-16 DIAGNOSIS — Z01419 Encounter for gynecological examination (general) (routine) without abnormal findings: Secondary | ICD-10-CM | POA: Insufficient documentation

## 2022-09-16 DIAGNOSIS — Z113 Encounter for screening for infections with a predominantly sexual mode of transmission: Secondary | ICD-10-CM | POA: Insufficient documentation

## 2022-09-16 DIAGNOSIS — N644 Mastodynia: Secondary | ICD-10-CM

## 2022-09-16 DIAGNOSIS — Z131 Encounter for screening for diabetes mellitus: Secondary | ICD-10-CM

## 2022-09-16 DIAGNOSIS — N6452 Nipple discharge: Secondary | ICD-10-CM | POA: Insufficient documentation

## 2022-09-16 DIAGNOSIS — F419 Anxiety disorder, unspecified: Secondary | ICD-10-CM

## 2022-09-16 NOTE — Progress Notes (Signed)
Gynecology Annual Exam  PCP: Patient, No Pcp Per  Chief Complaint:  Chief Complaint  Patient presents with   Gynecologic Exam    History of Present Illness: Patient is a 42 y.o. Tasha Rodriguez presents for annual exam. The patient has bilateral nipple discharge and pain in left breast, desires an evaluation for Autism   Nipple discharge: Has noticed a creamy white discharge from both nipples for the last 2 years. She is breastfeed, her youngest is 4. Over the last week she has noticed some tenderness at her left nipple, she felt a "pop" and then some green-white discharge came out followed by some blood and amber colored fluid. Now she has a general "awareness" of discomfort in her left breast.  She denies any recent injuries, fevers, or possible insect bite. She tends to wear sports bras. She does have a screening mammogram ordered for June 19.    LMP: Patient's last menstrual period was 09/06/2022 (approximate). Average Interval: regular, 28 days (notes this last cycle was 32 days and lasted a day or 2 longer) Duration of flow: 5 days Heavy Menses: no Clots: no Intermenstrual Bleeding: no Postcoital Bleeding: no Dysmenorrhea: no   The patient is sexually active with one female partner. She currently uses condoms for contraception. She denies dyspareunia.  The patient does perform self breast exams.  There is no notable family history of breast or ovarian cancer in her family.  The patient wears seatbelts: yes.   The patient has regular exercise:  walking and weight lifting .    The patient reports current symptoms of depression.  Has dealt with anxiety and depression her entire life but has not been treated for it in a long time. She was started on an SSRI after the birth of her youngest child, she used them for 2 years-does not feel it helped. She also wanders if she has Autism as all of her kids and her uncle have it.  Sxs:-difficult to leave the house, having panic attacks for no  reason (sweat, increased breathing, increased heart rate) -daily life is hard, having a hard time staying on top of tasks -her daughter has CP, it difficult to stay on top of all of her care  -Sleeps a lot   Works for a [employer] with her 3 children ages 20,12, and 3 PCP: does not have Dentist: last exam 2 years Eye exam has been years   Review of Systems: ROSsee HPI   Past Medical History:  Patient Active Problem List   Diagnosis Date Noted   Request for sterilization 01/01/2019   History of gestational diabetes 06/27/2018   BMI 33.0-33.9,adult 06/27/2018    Past Surgical History:  Past Surgical History:  Procedure Laterality Date   CESAREAN SECTION     CESAREAN SECTION N/A 10/14/2018   Procedure: CESAREAN SECTION;  Surgeon: Linzie Collin, MD;  Location: [facility] ORS;  Service: Obstetrics;  Laterality: N/A;   LEEP  2010    Gynecologic History:  Patient's last menstrual period was 09/06/2022 (approximate). Contraception: condoms Last Pap: Results were: 10/09/2020 NIL and HR HPV negative  Last mammogram: none  Obstetric History: [id number]  Family History:  Family History  Problem Relation Age of Onset   Diabetes Mother    Ovarian cancer Mother 71       s/p hyst, no chemo/rad; question cx   Diabetes Father    Liver disease Brother    Diabetes Maternal Aunt    Diabetes Maternal Uncle  Diabetes Maternal Grandmother    Diabetes Maternal Grandfather    Breast cancer Maternal Great-grandmother 59   Colon cancer Neg Hx     Social History:  Social History   Socioeconomic History   Marital status: Single    Spouse name: Not on file   Number of children: Not on file   Years of education: Not on file   Highest education level: Not on file  Occupational History   Not on file  Tobacco Use   Smoking status: Never   Smokeless tobacco: Never  Vaping Use   Vaping Use: Never used  Substance and Sexual Activity   Alcohol use: Not Currently   Drug use: No    Sexual activity: Yes    Birth control/protection: None  Other Topics Concern   Not on file  Social History Narrative   Not on file   Social Determinants of Health   Financial Resource Strain: Not on file  Food Insecurity: Not on file  Transportation Needs: Not on file  Physical Activity: Not on file  Stress: Not on file  Social Connections: Not on file  Intimate Partner Violence: Not on file    Allergies:  No Known Allergies  Medications: Prior to Admission medications   Medication Sig Start Date End Date Taking? Authorizing Provider  cholecalciferol (VITAMIN D) 1000 units tablet Take 1,000 Units by mouth daily. Patient not taking: Reported on 09/16/2022    [provider]  norethindrone-ethinyl estradiol (JUNEL FE 1/20) 1-20 MG-MCG tablet Take 1 tablet by mouth daily. Patient not taking: Reported on 09/16/2022 03/01/19   Gunnar Bulla, CNM  Prenatal Vit-Fe Fumarate-FA (PRENATAL MULTIVITAMIN) TABS tablet Take 1 tablet by mouth daily at 12 noon. Patient not taking: Reported on 09/16/2022    [provider]    Physical Exam Vitals: Blood pressure 127/70, pulse 73, weight 238 lb (108 kg), last menstrual period 09/06/2022.  General: NAD HEENT: normocephalic, anicteric Thyroid: no enlargement, no palpable nodules Pulmonary: No increased work of breathing, CTAB Cardiovascular: RRR, distal pulses 2+ Breast: Breast symmetrical, no tenderness, no palpable nodules or masses, no skin retraction present, No axillary or supraclavicular lymphadenopathy. Both nipples are inverted, pt states they have always been this way. -No obvious discharge but with stimulation and pt squeezing her nipples she was able to express clear amber color and then green-white discharge from  the left breast and then a creamy white discharge from the Right breast.  Abdomen: NABS, soft, non-tender, non-distended.  Umbilicus without lesions.  No hepatomegaly, splenomegaly or masses  palpable. No evidence of hernia  Genitourinary:  External: Normal external female genitalia.  Normal urethral meatus, normal Bartholin's and Skene's glands.    Vagina: Normal vaginal mucosa, no evidence of prolapse.  Good tone   Cervix: Grossly normal in appearance, no bleeding  Uterus: Non-enlarged, mobile, normal contour.  No CMT  Adnexa: ovaries non-enlarged, no adnexal masses  Rectal: deferred  Lymphatic: no evidence of inguinal lymphadenopathy Extremities: no edema, erythema, or tenderness Neurologic: Grossly intact Psychiatric: mood appropriate, affect full   Assessment: 42 y.o. Tasha Rodriguez routine annual exam  Plan: Problem List Items Addressed This Visit   None Visit Diagnoses     Anxiety and depression    -  Primary   Relevant Orders   Ambulatory referral to Psychiatry   Well woman exam       Relevant Orders   Hemoglobin A1c   HEP, RPR, HIV Panel   Hepatitis C antibody   Prolactin   CBC  Cervicovaginal ancillary only   Screening examination for venereal disease       Relevant Orders   HEP, RPR, HIV Panel   Hepatitis C antibody   Cervicovaginal ancillary only   Screening for diabetes mellitus       Relevant Orders   Hemoglobin A1c   Nipple discharge       Relevant Orders   Prolactin   Anaerobic and Aerobic Culture   MM Digital Diagnostic Bilat   Breast pain, left       Relevant Orders   MM Digital Diagnostic Bilat       1) Mammogram - recommend yearly screening mammogram.  Mammogram  diagnostic mammogram ordered.   -Anaerobic and aerobic swabs collected from Left nipple. Reviewed I am not certain if an infection is present, will hold off on antibiotics until results come back    2) STI screening  was offered and accepted  3) ASCCP guidelines and rational discussed.  Patient opts for every 3 years screening interval Dues in 2025  4) Contraception - the patient is currently using  condoms.  She is happy with her current form of contraception and plans to  continue (previously consented to tubal but was unable to have in the immediate PP, so never had it done)  5) Colonoscopy -- Screening recommended starting at age 26 for average risk individuals, age 47 for individuals deemed at increased risk (including African Americans) and recommended to continue until age 33.  For patient age 52-85 individualized approach is recommended.  Gold standard screening is via colonoscopy, Cologuard screening is an acceptable alternative for patient unwilling or unable to undergo colonoscopy.  "Colorectal cancer screening for average?risk adults: 2018 guideline update from the American Cancer Society"[place]: A Cancer Journal for Clinicians: Sep 07, 2016   6) Routine healthcare maintenance including cholesterol, diabetes screening discussed Ordered today -Pt is aware of her high BMI, tries to eat well and stay active, will call if she desires a referral for weight loss  7) Depression/Anxiety, concern for Autism, referral to psychiatry made.   8) RTC in 1 year   Carie Caddy, [place]   09/16/2022, 12:37 PM

## 2022-09-17 LAB — CBC
Hematocrit: 42.8 % (ref 34.0–46.6)
Hemoglobin: 14 g/dL (ref 11.1–15.9)
MCH: 29.5 pg (ref 26.6–33.0)
MCHC: 32.7 g/dL (ref 31.5–35.7)
MCV: 90 fL (ref 79–97)
Platelets: 294 10*3/uL (ref 150–450)
RBC: 4.75 x10E6/uL (ref 3.77–5.28)
RDW: 12.3 % (ref 11.7–15.4)
WBC: 9.4 10*3/uL (ref 3.4–10.8)

## 2022-09-17 LAB — PROLACTIN: Prolactin: 19.9 ng/mL (ref 4.8–33.4)

## 2022-09-17 LAB — HEMOGLOBIN A1C
Est. average glucose Bld gHb Est-mCnc: 131 mg/dL
Hgb A1c MFr Bld: 6.2 % — ABNORMAL HIGH (ref 4.8–5.6)

## 2022-09-17 LAB — HEP, RPR, HIV PANEL
HIV Screen 4th Generation wRfx: NONREACTIVE
Hepatitis B Surface Ag: NEGATIVE
RPR Ser Ql: NONREACTIVE

## 2022-09-17 LAB — HEPATITIS C ANTIBODY: Hep C Virus Ab: NONREACTIVE

## 2022-09-19 LAB — CERVICOVAGINAL ANCILLARY ONLY
Chlamydia: NEGATIVE
Comment: NEGATIVE
Comment: NEGATIVE
Comment: NORMAL
Neisseria Gonorrhea: NEGATIVE
Trichomonas: NEGATIVE

## 2022-09-21 LAB — ANAEROBIC AND AEROBIC CULTURE

## 2022-09-28 ENCOUNTER — Ambulatory Visit
Admission: RE | Admit: 2022-09-28 | Discharge: 2022-09-28 | Disposition: A | Discharge status: Home / Self Care | Payer: Medicaid Other | Source: Ambulatory Visit

## 2022-09-28 DIAGNOSIS — Z1231 Encounter for screening mammogram for malignant neoplasm of breast: Secondary | ICD-10-CM

## 2022-12-21 ENCOUNTER — Emergency Department
Admission: EM | Admit: 2022-12-21 | Discharge: 2022-12-21 | Disposition: A | Discharge status: Home / Self Care | Payer: Medicaid Other | Attending: Emergency Medicine | Admitting: Emergency Medicine

## 2022-12-21 ENCOUNTER — Emergency Department: Payer: Medicaid Other

## 2022-12-21 DIAGNOSIS — Y92410 Unspecified street and highway as the place of occurrence of the external cause: Secondary | ICD-10-CM | POA: Insufficient documentation

## 2022-12-21 DIAGNOSIS — S161XXA Strain of muscle, fascia and tendon at neck level, initial encounter: Secondary | ICD-10-CM | POA: Insufficient documentation

## 2022-12-21 DIAGNOSIS — S86911A Strain of unspecified muscle(s) and tendon(s) at lower leg level, right leg, initial encounter: Secondary | ICD-10-CM | POA: Insufficient documentation

## 2022-12-21 DIAGNOSIS — R519 Headache, unspecified: Secondary | ICD-10-CM | POA: Diagnosis present

## 2022-12-21 MED ORDER — IBUPROFEN 800 MG PO TABS
800.0000 mg | ORAL_TABLET | ORAL | Status: AC
  Administered 2022-12-21: 800 mg via ORAL
  Filled 2022-12-21: qty 1
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
## 2023-03-17 ENCOUNTER — Encounter: Payer: Self-pay | Admitting: Certified Nurse Midwife

## 2023-03-17 ENCOUNTER — Ambulatory Visit: Payer: Medicaid Other | Admitting: Certified Nurse Midwife

## 2023-03-17 VITALS — BP 132/85 | HR 76 | Ht 69.0 in | Wt 252.6 lb

## 2023-03-17 DIAGNOSIS — Z713 Dietary counseling and surveillance: Secondary | ICD-10-CM

## 2023-03-17 DIAGNOSIS — Z6837 Body mass index (BMI) 37.0-37.9, adult: Secondary | ICD-10-CM

## 2023-03-17 DIAGNOSIS — E669 Obesity, unspecified: Secondary | ICD-10-CM

## 2023-03-17 DIAGNOSIS — Z79899 Other long term (current) drug therapy: Secondary | ICD-10-CM | POA: Diagnosis not present

## 2023-03-17 DIAGNOSIS — Z7689 Persons encountering health services in other specified circumstances: Secondary | ICD-10-CM

## 2023-03-17 NOTE — Progress Notes (Signed)
Subjective:  Tasha Rodriguez is a 42 y.o. [phone] at Unknown being seen today for weight loss management- initial visit.    Patient reports General ROS: negative and reports previous weight loss attempts:  Onset was gradual due to pregnancy and childhood trama Associated symptoms include: fatigue, depression,  change in clothing fit and menstrual changes. Previous/Current treatment includes: keto diet, and exercise Pertinent medical history includes: none  Risk factors include: none  The patient has a surgical history of: cesarean section , leep Pertinent social history includes: none.  Past evaluation has included:  hemoglobin A1c,  CBC . Labs ordered today: CMP, TSH&T4, lipid screen   The following portions of the patient's history were reviewed and updated as appropriate: allergies, current medications, past family history, past medical history, past social history, past surgical history and problem list.   Objective:   Vitals:   03/17/23 1125  BP: 132/85  Pulse: 76  Weight: 252 lb 9.6 oz (114.6 kg)  Height: 5\' 9"  (1.753 m)  Waist                  48.5 inches   General:  Alert, oriented and cooperative. Patient is in no acute distress.  PE: Well groomed female in no current distress,   Mental Status: Normal mood and affect. Normal behavior. Normal judgment and thought content.   Current BMI: Body mass index is 37.3 kg/m.   Assessment and Plan:  Obesity  1. Encounter for weight management  Discussed medication options including phentermine & vitamin B 12 injections as well as GLP-1 injectable. She is going to contact her insurance to verify coverage. She will reach out via my chart once she has decided on medication use.   She has a goal of 65-80 lbs.  Current exercise : daily cardio 30 minutes , every other day weights .   Reviewed risks and benefits of use . She verbalizes understanding.    Please refer to After Visit Summary for other counseling  recommendations.    Doreene Burke, CNM   Consider the Low Glycemic Index Diet and 6 smaller meals daily .  This boosts your metabolism and regulates your sugars:   Use the protein bar by Atkins because they have lots of fiber in them  Find the low carb flatbreads, tortillas and pita breads for sandwiches:  Joseph's makes a pita bread and a flat bread , available at [facility] and [employer]; Toufayah makes a low carb flatbread available at [employer] and [employer] that is 9 net carbs and 100 cal Mission makes a low carb whole wheat tortilla available at [employer] most grocery stores with 6 net carbs and 210 cal  [place] yogurt can still have a lot of carbs .  Dannon Light N fit has 80 cal and 8 carbs

## 2023-03-18 LAB — COMPREHENSIVE METABOLIC PANEL
ALT: 22 [IU]/L (ref 0–32)
AST: 16 [IU]/L (ref 0–40)
Albumin: 4.3 g/dL (ref 3.9–4.9)
Alkaline Phosphatase: 88 [IU]/L (ref 44–121)
BUN/Creatinine Ratio: 14 (ref 9–23)
BUN: 10 mg/dL (ref 6–24)
Bilirubin Total: 0.3 mg/dL (ref 0.0–1.2)
CO2: 21 mmol/L (ref 20–29)
Calcium: 9.3 mg/dL (ref 8.7–10.2)
Chloride: 101 mmol/L (ref 96–106)
Creatinine, Ser: 0.72 mg/dL (ref 0.57–1.00)
Globulin, Total: 2.6 g/dL (ref 1.5–4.5)
Glucose: 151 mg/dL — ABNORMAL HIGH (ref 70–99)
Potassium: 4.3 mmol/L (ref 3.5–5.2)
Sodium: 136 mmol/L (ref 134–144)
Total Protein: 6.9 g/dL (ref 6.0–8.5)
eGFR: 108 mL/min/{1.73_m2} (ref >59)

## 2023-03-18 LAB — TSH+FREE T4
Free T4: 1.13 ng/dL (ref 0.82–1.77)
TSH: 1.01 u[IU]/mL (ref 0.450–4.500)

## 2023-03-18 LAB — LIPID PANEL
Chol/HDL Ratio: 3.9 {ratio} (ref 0.0–4.4)
Cholesterol, Total: 150 mg/dL (ref 100–199)
HDL: 38 mg/dL — ABNORMAL LOW (ref >39)
LDL Chol Calc (NIH): 75 mg/dL (ref 0–99)
Triglycerides: 225 mg/dL — ABNORMAL HIGH (ref 0–149)
VLDL Cholesterol Cal: 37 mg/dL (ref 5–40)

## 2023-03-19 ENCOUNTER — Encounter: Payer: Self-pay | Admitting: Certified Nurse Midwife

## 2023-03-21 ENCOUNTER — Other Ambulatory Visit: Payer: Self-pay | Admitting: Certified Nurse Midwife

## 2023-03-21 MED ORDER — WEGOVY 0.25 MG/0.5ML ~~LOC~~ SOAJ: 0.2500 mg | SUBCUTANEOUS | 0 refills | Status: DC

## 2023-04-21 ENCOUNTER — Ambulatory Visit: Payer: Medicaid Other | Admitting: Certified Nurse Midwife

## 2023-04-21 ENCOUNTER — Encounter: Payer: Self-pay | Admitting: Certified Nurse Midwife

## 2023-04-21 VITALS — BP 109/76 | HR 73 | Ht 69.0 in | Wt 245.4 lb

## 2023-04-21 DIAGNOSIS — Z713 Dietary counseling and surveillance: Secondary | ICD-10-CM

## 2023-04-21 DIAGNOSIS — Z7689 Persons encountering health services in other specified circumstances: Secondary | ICD-10-CM

## 2023-04-21 MED ORDER — SEMAGLUTIDE-WEIGHT MANAGEMENT 0.5 MG/0.5ML ~~LOC~~ SOAJ: 0.5000 mg | SUBCUTANEOUS | 0 refills | Status: DC

## 2023-04-21 NOTE — Progress Notes (Signed)
 SUBJECTIVE:  43 y.o. here for follow-up weight loss visit, previously seen 4 weeks ago. Denies any concerns and feels like medication is working well, She denies any negative side effects. State she has noticed having some vivid wild dreams .   OBJECTIVE:  BP 109/76   Pulse 73   Ht 5' 9 (1.753 m)   Wt 245 lb 6.4 oz (111.3 kg)   BMI 36.24 kg/m   Body mass index is 36.24 kg/m.   Waist:48.0 in  Patient appears well. ASSESSMENT:  Obesity- responding well to weight loss plan PLAN:  To continue with current medications. Increased to 0.5 mg wkly . RTC in 4 weeks as planned   She continues to do keto and work out cardio 30 min daily, weights every other day.  Zelda Hummer, CNM

## 2023-04-21 NOTE — Patient Instructions (Signed)
Calorie Counting for Weight Loss Calories are units of energy. Your body needs a certain number of calories from food to keep going throughout the day. When you eat or drink more calories than your body needs, your body stores the extra calories mostly as fat. When you eat or drink fewer calories than your body needs, your body burns fat to get the energy it needs. Calorie counting means keeping track of how many calories you eat and drink each day. Calorie counting can be helpful if you need to lose weight. If you eat fewer calories than your body needs, you should lose weight. Ask your health care provider what a healthy weight is for you. For calorie counting to work, you will need to eat the right number of calories each day to lose a healthy amount of weight per week. A dietitian can help you figure out how many calories you need in a day and will suggest ways to reach your calorie goal. A healthy amount of weight to lose each week is usually 1-2 lb (0.5-0.9 kg). This usually means that your daily calorie intake should be reduced by 500-750 calories. Eating 1,200-1,500 calories a day can help most women lose weight. Eating 1,500-1,800 calories a day can help most men lose weight. What do I need to know about calorie counting? Work with your health care provider or dietitian to determine how many calories you should get each day. To meet your daily calorie goal, you will need to: Find out how many calories are in each food that you would like to eat. Try to do this before you eat. Decide how much of the food you plan to eat. Keep a food log. Do this by writing down what you ate and how many calories it had. To successfully lose weight, it is important to balance calorie counting with a healthy lifestyle that includes regular activity. Where do I find calorie information?  The number of calories in a food can be found on a Nutrition Facts label. If a food does not have a Nutrition Facts label, try  to look up the calories online or ask your dietitian for help. Remember that calories are listed per serving. If you choose to have more than one serving of a food, you will have to multiply the calories per serving by the number of servings you plan to eat. For example, the label on a package of bread might say that a serving size is 1 slice and that there are 90 calories in a serving. If you eat 1 slice, you will have eaten 90 calories. If you eat 2 slices, you will have eaten 180 calories. How do I keep a food log? After each time that you eat, record the following in your food log as soon as possible: What you ate. Be sure to include toppings, sauces, and other extras on the food. How much you ate. This can be measured in cups, ounces, or number of items. How many calories were in each food and drink. The total number of calories in the food you ate. Keep your food log near you, such as in a pocket-sized notebook or on an app or website on your mobile phone. Some programs will calculate calories for you and show you how many calories you have left to meet your daily goal. What are some portion-control tips? Know how many calories are in a serving. This will help you know how many servings you can have of a certain   food. Use a measuring cup to measure serving sizes. You could also try weighing out portions on a kitchen scale. With time, you will be able to estimate serving sizes for some foods. Take time to put servings of different foods on your favorite plates or in your favorite bowls and cups so you know what a serving looks like. Try not to eat straight from a food's packaging, such as from a bag or box. Eating straight from the package makes it hard to see how much you are eating and can lead to overeating. Put the amount you would like to eat in a cup or on a plate to make sure you are eating the right portion. Use smaller plates, glasses, and bowls for smaller portions and to prevent  overeating. Try not to multitask. For example, avoid watching TV or using your computer while eating. If it is time to eat, sit down at a table and enjoy your food. This will help you recognize when you are full. It will also help you be more mindful of what and how much you are eating. What are tips for following this plan? Reading food labels Check the calorie count compared with the serving size. The serving size may be smaller than what you are used to eating. Check the source of the calories. Try to choose foods that are high in protein, fiber, and vitamins, and low in saturated fat, trans fat, and sodium. Shopping Read nutrition labels while you shop. This will help you make healthy decisions about which foods to buy. Pay attention to nutrition labels for low-fat or fat-free foods. These foods sometimes have the same number of calories or more calories than the full-fat versions. They also often have added sugar, starch, or salt to make up for flavor that was removed with the fat. Make a grocery list of lower-calorie foods and stick to it. Cooking Try to cook your favorite foods in a healthier way. For example, try baking instead of frying. Use low-fat dairy products. Meal planning Use more fruits and vegetables. One-half of your plate should be fruits and vegetables. Include lean proteins, such as chicken, turkey, and fish. Lifestyle Each week, aim to do one of the following: 150 minutes of moderate exercise, such as walking. 75 minutes of vigorous exercise, such as running. General information Know how many calories are in the foods you eat most often. This will help you calculate calorie counts faster. Find a way of tracking calories that works for you. Get creative. Try different apps or programs if writing down calories does not work for you. What foods should I eat?  Eat nutritious foods. It is better to have a nutritious, high-calorie food, such as an avocado, than a food with  few nutrients, such as a bag of potato chips. Use your calories on foods and drinks that will fill you up and will not leave you hungry soon after eating. Examples of foods that fill you up are nuts and nut butters, vegetables, lean proteins, and high-fiber foods such as whole grains. High-fiber foods are foods with more than 5 g of fiber per serving. Pay attention to calories in drinks. Low-calorie drinks include water and unsweetened drinks. The items listed above may not be a complete list of foods and beverages you can eat. Contact a dietitian for more information. What foods should I limit? Limit foods or drinks that are not good sources of vitamins, minerals, or protein or that are high in unhealthy fats. These   include: Candy. Other sweets. Sodas, specialty coffee drinks, alcohol, and juice. The items listed above may not be a complete list of foods and beverages you should avoid. Contact a dietitian for more information. How do I count calories when eating out? Pay attention to portions. Often, portions are much larger when eating out. Try these tips to keep portions smaller: Consider sharing a meal instead of getting your own. If you get your own meal, eat only half of it. Before you start eating, ask for a container and put half of your meal into it. When available, consider ordering smaller portions from the menu instead of full portions. Pay attention to your food and drink choices. Knowing the way food is cooked and what is included with the meal can help you eat fewer calories. If calories are listed on the menu, choose the lower-calorie options. Choose dishes that include vegetables, fruits, whole grains, low-fat dairy products, and lean proteins. Choose items that are boiled, broiled, grilled, or steamed. Avoid items that are buttered, battered, fried, or served with cream sauce. Items labeled as crispy are usually fried, unless stated otherwise. Choose water, low-fat milk,  unsweetened iced tea, or other drinks without added sugar. If you want an alcoholic beverage, choose a lower-calorie option, such as a glass of wine or light beer. Ask for dressings, sauces, and syrups on the side. These are usually high in calories, so you should limit the amount you eat. If you want a salad, choose a garden salad and ask for grilled meats. Avoid extra toppings such as bacon, cheese, or fried items. Ask for the dressing on the side, or ask for olive oil and vinegar or lemon to use as dressing. Estimate how many servings of a food you are given. Knowing serving sizes will help you be aware of how much food you are eating at restaurants. Where to find more information Centers for Disease Control and Prevention: www.cdc.gov U.[place]. Department of Agriculture: myplate.gov Summary Calorie counting means keeping track of how many calories you eat and drink each day. If you eat fewer calories than your body needs, you should lose weight. A healthy amount of weight to lose per week is usually 1-2 lb (0.5-0.9 kg). This usually means reducing your daily calorie intake by 500-750 calories. The number of calories in a food can be found on a Nutrition Facts label. If a food does not have a Nutrition Facts label, try to look up the calories online or ask your dietitian for help. Use smaller plates, glasses, and bowls for smaller portions and to prevent overeating. Use your calories on foods and drinks that will fill you up and not leave you hungry shortly after a meal. This information is not intended to replace advice given to you by your health care provider. Make sure you discuss any questions you have with your health care provider. Document Revised: 05/09/2019 Document Reviewed: 05/09/2019 Elsevier Patient Education  2023 Elsevier Inc.  

## 2023-05-08 ENCOUNTER — Ambulatory Visit: Payer: Medicaid Other | Admitting: Dietician

## 2023-05-25 ENCOUNTER — Ambulatory Visit: Payer: Medicaid Other | Admitting: Certified Nurse Midwife

## 2023-05-25 ENCOUNTER — Encounter: Payer: Self-pay | Admitting: Certified Nurse Midwife

## 2023-05-25 VITALS — BP 120/76 | HR 82 | Wt 235.8 lb

## 2023-05-25 DIAGNOSIS — Z7689 Persons encountering health services in other specified circumstances: Secondary | ICD-10-CM

## 2023-05-25 DIAGNOSIS — Z7985 Long-term (current) use of injectable non-insulin antidiabetic drugs: Secondary | ICD-10-CM

## 2023-05-25 DIAGNOSIS — E669 Obesity, unspecified: Secondary | ICD-10-CM | POA: Diagnosis not present

## 2023-05-25 DIAGNOSIS — Z09 Encounter for follow-up examination after completed treatment for conditions other than malignant neoplasm: Secondary | ICD-10-CM

## 2023-05-25 DIAGNOSIS — Z713 Dietary counseling and surveillance: Secondary | ICD-10-CM | POA: Diagnosis not present

## 2023-05-25 DIAGNOSIS — Z6834 Body mass index (BMI) 34.0-34.9, adult: Secondary | ICD-10-CM | POA: Diagnosis not present

## 2023-05-25 MED ORDER — SEMAGLUTIDE-WEIGHT MANAGEMENT 1 MG/0.5ML ~~LOC~~ SOAJ: 1.0000 mg | SUBCUTANEOUS | 0 refills | Status: DC

## 2023-05-25 NOTE — Patient Instructions (Signed)

## 2023-05-25 NOTE — Progress Notes (Signed)
SUBJECTIVE:  43 y.o. here for follow-up weight loss visit, previously seen 4 weeks ago. Denies any concerns and feels like medication is  working well. She has lost 10 lbs this past month. She has some mild constipation with use. Discussed some self help treatment options that she will try. She would like to go up to the next dose.   OBJECTIVE:  BP 120/76   Pulse 82   Wt 235 lb 12.8 oz (107 kg)   BMI 34.82 kg/m   Body mass index is 34.82 kg/m.  Waist: 43 in ( down 5 in).   Patient appears well. ASSESSMENT:  Obesity- responding well to weight loss plan PLAN:  To continue with current medications. 1 mg Semaglutide injections RTC in 4 weeks as planned  Doreene Burke, CNM

## 2023-06-15 ENCOUNTER — Other Ambulatory Visit: Payer: Self-pay | Admitting: Certified Nurse Midwife

## 2023-06-15 MED ORDER — SEMAGLUTIDE-WEIGHT MANAGEMENT 1 MG/0.5ML ~~LOC~~ SOAJ: 1.0000 mg | SUBCUTANEOUS | 0 refills | Status: DC

## 2023-07-05 ENCOUNTER — Ambulatory Visit: Payer: Medicaid Other | Admitting: Certified Nurse Midwife

## 2023-07-05 ENCOUNTER — Encounter: Payer: Self-pay | Admitting: Certified Nurse Midwife

## 2023-07-05 VITALS — BP 117/75 | HR 88 | Ht 67.5 in | Wt 223.1 lb

## 2023-07-05 DIAGNOSIS — Z6834 Body mass index (BMI) 34.0-34.9, adult: Secondary | ICD-10-CM

## 2023-07-05 DIAGNOSIS — Z713 Dietary counseling and surveillance: Secondary | ICD-10-CM | POA: Diagnosis not present

## 2023-07-05 DIAGNOSIS — E669 Obesity, unspecified: Secondary | ICD-10-CM | POA: Diagnosis not present

## 2023-07-05 DIAGNOSIS — Z7689 Persons encountering health services in other specified circumstances: Secondary | ICD-10-CM

## 2023-07-05 MED ORDER — SEMAGLUTIDE-WEIGHT MANAGEMENT 1.7 MG/0.75ML ~~LOC~~ SOAJ: 1.7000 mg | SUBCUTANEOUS | 0 refills | Status: DC

## 2023-07-05 NOTE — Progress Notes (Signed)
 SUBJECTIVE:  43 y.o. here for follow-up weight loss visit, previously seen 4 weeks ago. Denies any concerns and feels like medication is work well . She has lost 12 lbs this past month. She states the medication has really helped with her appetite and it has helped her identify when she is full. She is walking nightly. She has been treating constipation with Miralax prn and increased her water intake, which has helped.    OBJECTIVE:  BP 117/75   Pulse 88   Ht 5' 7.5" (1.715 m)   Wt 223 lb 1.6 oz (101.2 kg)   BMI 34.43 kg/m   Body mass index is 34.43 kg/m. Patient appears well. ASSESSMENT:  Obesity- responding well to weight loss plan PLAN:  To continue with current medications. Dose increased to 1.7 mg weekly RTC in 4 weeks as planned  Doreene Burke, CNM

## 2023-07-05 NOTE — Patient Instructions (Signed)

## 2023-08-04 ENCOUNTER — Ambulatory Visit: Admitting: Certified Nurse Midwife

## 2023-08-04 VITALS — BP 112/72 | HR 78 | Ht 69.0 in | Wt 219.7 lb

## 2023-08-04 DIAGNOSIS — E669 Obesity, unspecified: Secondary | ICD-10-CM | POA: Diagnosis not present

## 2023-08-04 DIAGNOSIS — Z6832 Body mass index (BMI) 32.0-32.9, adult: Secondary | ICD-10-CM

## 2023-08-04 DIAGNOSIS — Z7689 Persons encountering health services in other specified circumstances: Secondary | ICD-10-CM

## 2023-08-04 DIAGNOSIS — K5900 Constipation, unspecified: Secondary | ICD-10-CM

## 2023-08-04 MED ORDER — SEMAGLUTIDE-WEIGHT MANAGEMENT 1.7 MG/0.75ML ~~LOC~~ SOAJ: 1.7000 mg | SUBCUTANEOUS | 0 refills | Status: DC

## 2023-08-04 NOTE — Progress Notes (Signed)
 SUBJECTIVE:  43 y.o. here for follow-up weight loss visit, previously seen 4 weeks ago. Denies any concerns and feels like medication is working. She is experiencing some constipation so she would like to maintain her current medication dose. She has lost 4 lbs this past month.   OBJECTIVE:  BP 112/72   Pulse 78   Ht 5\' 9"  (1.753 m)   Wt 219 lb 11.2 oz (99.7 kg)   LMP 07/23/2023 (Exact Date)   BMI 32.44 kg/m   Body mass index is 32.44 kg/m.  Waist 41 in.   Patient appears well. ASSESSMENT:  Obesity- responding well to weight loss plan PLAN:  To continue with current medications.  RTC in 4 weeks as planned  Alise Appl, CNM

## 2023-08-28 ENCOUNTER — Ambulatory Visit: Admitting: Certified Nurse Midwife

## 2023-09-05 ENCOUNTER — Ambulatory Visit: Admitting: Certified Nurse Midwife

## 2023-09-05 VITALS — BP 96/62 | HR 62 | Ht 69.0 in | Wt 211.8 lb

## 2023-09-05 DIAGNOSIS — Z6831 Body mass index (BMI) 31.0-31.9, adult: Secondary | ICD-10-CM

## 2023-09-05 DIAGNOSIS — E669 Obesity, unspecified: Secondary | ICD-10-CM

## 2023-09-05 DIAGNOSIS — Z7689 Persons encountering health services in other specified circumstances: Secondary | ICD-10-CM

## 2023-09-05 MED ORDER — SEMAGLUTIDE-WEIGHT MANAGEMENT 1.7 MG/0.75ML ~~LOC~~ SOAJ: 1.7000 mg | SUBCUTANEOUS | 0 refills | Status: DC

## 2023-09-05 NOTE — Progress Notes (Signed)
 SUBJECTIVE:  43 y.o. here for follow-up weight loss visit, previously seen 4 weeks ago. Denies any concerns and feels like medication is working well. She lost 8 lbs this past month. She state she is starting to feel some food cravings but given she has lost a good amount this month she would like to try another month on this dose. She is managing the constipation/indigestion with smaller meals. She eats 2 prunes daily and drinks lots of water which helps . She has also used miralax but has not had to this month.   OBJECTIVE:  BP 96/62   Pulse 62   Ht 5\' 9"  (1.753 m)   Wt 211 lb 12.8 oz (96.1 kg)   BMI 31.28 kg/m   Body mass index is 31.28 kg/m.  Waist: 41  Patient appears well. ASSESSMENT:  Obesity- responding well to weight loss plan PLAN:  To continue with current medications.  RTC in 4 weeks as planned  Tasha Rodriguez, CNM

## 2023-09-29 ENCOUNTER — Other Ambulatory Visit: Payer: Self-pay | Admitting: Certified Nurse Midwife

## 2023-09-29 DIAGNOSIS — Z1231 Encounter for screening mammogram for malignant neoplasm of breast: Secondary | ICD-10-CM

## 2023-10-05 ENCOUNTER — Ambulatory Visit
Admission: RE | Admit: 2023-10-05 | Discharge: 2023-10-05 | Disposition: A | Discharge status: Home / Self Care | Source: Ambulatory Visit

## 2023-10-05 ENCOUNTER — Encounter: Payer: Self-pay | Admitting: Certified Nurse Midwife

## 2023-10-05 ENCOUNTER — Ambulatory Visit: Admitting: Certified Nurse Midwife

## 2023-10-05 VITALS — BP 91/56 | HR 77 | Ht 69.0 in | Wt 211.5 lb

## 2023-10-05 DIAGNOSIS — Z6839 Body mass index (BMI) 39.0-39.9, adult: Secondary | ICD-10-CM | POA: Diagnosis not present

## 2023-10-05 DIAGNOSIS — E669 Obesity, unspecified: Secondary | ICD-10-CM | POA: Diagnosis not present

## 2023-10-05 DIAGNOSIS — Z7689 Persons encountering health services in other specified circumstances: Secondary | ICD-10-CM

## 2023-10-05 DIAGNOSIS — Z1231 Encounter for screening mammogram for malignant neoplasm of breast: Secondary | ICD-10-CM

## 2023-10-05 MED ORDER — SEMAGLUTIDE-WEIGHT MANAGEMENT 2.4 MG/0.75ML ~~LOC~~ SOAJ: 2.4000 mg | SUBCUTANEOUS | 0 refills | Status: DC

## 2023-10-05 NOTE — Patient Instructions (Signed)

## 2023-10-05 NOTE — Progress Notes (Signed)
 SUBJECTIVE:  43 y.o. here for follow-up weight loss visit, previously seen 4 weeks ago. Denies any concerns and feels like medication is working. She did not loss weight however, she did lose inches off her waist. Discussed likely increase in muscle mass.   OBJECTIVE:  There were no vitals taken for this visit.  There is no height or weight on file to calculate BMI. Waist: 39.5 in  ( was 41 inches last visit).   Patient appears well. ASSESSMENT:  Obesity- responding well to weight loss plan PLAN:  To continue with current medications.   RTC in 4 weeks as planned  Season Astacio, [place]

## 2023-10-09 ENCOUNTER — Encounter: Payer: Self-pay | Admitting: Certified Nurse Midwife

## 2023-10-10 ENCOUNTER — Ambulatory Visit (INDEPENDENT_AMBULATORY_CARE_PROVIDER_SITE_OTHER): Admitting: Certified Nurse Midwife

## 2023-10-10 ENCOUNTER — Encounter: Payer: Self-pay | Admitting: Certified Nurse Midwife

## 2023-10-10 VITALS — BP 105/73 | HR 71 | Ht 69.0 in | Wt 209.6 lb

## 2023-10-10 DIAGNOSIS — Z23 Encounter for immunization: Secondary | ICD-10-CM | POA: Diagnosis not present

## 2023-10-10 DIAGNOSIS — Z01419 Encounter for gynecological examination (general) (routine) without abnormal findings: Secondary | ICD-10-CM

## 2023-10-10 NOTE — Progress Notes (Signed)
 GYNECOLOGY ANNUAL PREVENTATIVE CARE ENCOUNTER NOTE  History:     Tasha Rodriguez is a 43 y.o. [phone] female here for a routine annual gynecologic exam.  Current complaints: none,still states is having occasional discharge from nipple bilaterally when she squeezes nipples, left with yelllow clear d/c and right with white thicker d/c.   Denies abnormal vaginal bleeding, discharge, pelvic pain, problems with intercourse or other gynecologic concerns.     Social Relationship:  Living:3 children Work: [employer]  Exercise: 4-= 5 x weeks Smoke/Alcohol/drug use: denies use   Gynecologic History Patient's last menstrual period was 09/20/2023. Menses monthly no btb, lasting 5 days, 2nd day being heaviest changing pads/ tampons every 1-2 hrs. Mild cramps uses heating pad and occasional ibuprofen  for symptoms.  Contraception: abstinence Last Pap: 10/09/2020. Results were: normal with negative HPV Mammogram : 10/04/23 normal    Obstetric History OB History  Gravida Para Term Preterm AB Living  4 3 3  1 3   SAB IAB Ectopic Multiple Live Births  1   0 3    # Outcome Date GA Lbr Len/2nd Weight Sex Type Anes PTL Lv  4 Term 10/14/18 [redacted]w[redacted]d  4 lb 6.9 oz (2.01 kg) F CS-LTranv   LIV  3 SAB 2018 [redacted]w[redacted]d         2 Term 11/11/09   7 lb 5 oz (3.317 kg) F CS-Unspec  N LIV  1 Term 11/12/99   7 lb 9.6 oz (3.447 kg) F CS-Unspec EPI  LIV     Complications: Failure to Progress in First Stage, Fetal Intolerance    Past Medical History:  Diagnosis Date   Cervical dysplasia 2010   Diabetes mellitus without complication (HCC)    HPV (human papilloma virus) infection     Past Surgical History:  Procedure Laterality Date   CESAREAN SECTION     CESAREAN SECTION N/A 10/14/2018   Procedure: CESAREAN SECTION;  Surgeon: Janit Alm Agent, MD;  Location: [facility] ORS;  Service: Obstetrics;  Laterality: N/A;   LEEP  2010    Current Outpatient Medications on File Prior to Visit  Medication Sig  Dispense Refill   Semaglutide -Weight Management 2.4 MG/0.75ML SOAJ Inject 2.4 mg into the skin once a week. 3 mL 0   No current facility-administered medications on file prior to visit.    No Known Allergies  Social History:  reports that she has never smoked. She has never used smokeless tobacco. She reports that she does not currently use alcohol. She reports that she does not use drugs. Gets adequate Ca in diet, denies vit D intact, does exercise, does not do monthly SBE  Family History  Problem Relation Age of Onset   Diabetes Mother    Ovarian cancer Mother 62       s/p hyst, no chemo/rad; question cx   Diabetes Father    Diabetes Maternal Aunt    Diabetes Maternal Uncle    Diabetes Maternal Grandmother    Diabetes Maternal Grandfather    Liver disease Brother    Colon cancer Neg Hx     The following portions of the patient's history were reviewed and updated as appropriate: allergies, current medications, past family history, past medical history, past social history, past surgical history and problem list.  Review of Systems Pertinent items noted in HPI and remainder of comprehensive ROS otherwise negative.  Physical Exam:  BP 105/73   Pulse 71   Ht 5' 9 (1.753 m)  Wt 209 lb 9.6 oz (95.1 kg)   LMP 09/20/2023   BMI 30.95 kg/m  CONSTITUTIONAL: Well-developed, well-nourished female in no acute distress.  HENT:  Normocephalic, atraumatic, External right and left ear normal. Oropharynx is clear and moist EYES: Conjunctivae and EOM are normal. Pupils are equal, round, and reactive to light. No scleral icterus.  NECK: Normal range of motion, supple, no masses.  Normal thyroid.  SKIN: Skin is warm and dry. No rash noted. Not diaphoretic. No erythema. No pallor. MUSCULOSKELETAL: Normal range of motion. No tenderness.  No cyanosis, clubbing, or edema.  2+ distal pulses. NEUROLOGIC: Alert and oriented to person, place, and time. Normal reflexes, muscle tone coordination.   PSYCHIATRIC: Normal mood and affect. Normal behavior. Normal judgment and thought content. CARDIOVASCULAR: Normal heart rate noted, regular rhythm RESPIRATORY: Clear to auscultation bilaterally. Effort and breath sounds normal, no problems with respiration noted. BREASTS: Symmetric in size. No masses, tenderness, skin changes, nipple drainage, or lymphadenopathy bilaterally.  ABDOMEN: Soft, no distention noted.  No tenderness, rebound or guarding.  PELVIC: Normal appearing external genitalia and urethral meatus; normal appearing vaginal mucosa and cervix.  No abnormal discharge noted.  Pap smear not due.  Normal uterine size, no other palpable masses, no uterine or adnexal tenderness.  .   Assessment and Plan:    Annual Well Women GYN Exam    Pap:10/09/20 (neg), not due  Mammogram :10/05/23 (neg results) Labs:PCP Refills:none Referral:none Education provided on incorporating Vit D supplement   Routine preventative health maintenance measures emphasized. Please refer to After Visit Summary for other counseling recommendations.      Zelda Hummer, CNM [facility]  [facility],  [facility]

## 2023-10-10 NOTE — Patient Instructions (Signed)
 Preventive Care 42-42 Years Old, Female Preventive care refers to lifestyle choices and visits with your health care provider that can promote health and wellness. Preventive care visits are also called wellness exams. What can I expect for my preventive care visit? Counseling Your health care provider may ask you questions about your: Medical history, including: Past medical problems. Family medical history. Pregnancy history. Current health, including: Menstrual cycle. Method of birth control. Emotional well-being. Home life and relationship well-being. Sexual activity and sexual health. Lifestyle, including: Alcohol, nicotine  or tobacco, and drug use. Access to firearms. Diet, exercise, and sleep habits. Work and work Astronomer. Sunscreen use. Safety issues such as seatbelt and bike helmet use. Physical exam Your health care provider will check your: Height and weight. These may be used to calculate your BMI (body mass index). BMI is a measurement that tells if you are at a healthy weight. Waist circumference. This measures the distance around your waistline. This measurement also tells if you are at a healthy weight and may help predict your risk of certain diseases, such as type 2 diabetes and high blood pressure. Heart rate and blood pressure. Body temperature. Skin for abnormal spots. What immunizations do I need?  Vaccines are usually given at various ages, according to a schedule. Your health care provider will recommend vaccines for you based on your age, medical history, and lifestyle or other factors, such as travel or where you work. What tests do I need? Screening Your health care provider may recommend screening tests for certain conditions. This may include: Lipid and cholesterol levels. Diabetes screening. This is done by checking your blood sugar (glucose) after you have not eaten for a while (fasting). Pelvic exam and Pap test. Hepatitis B test. Hepatitis C  test. HIV (human immunodeficiency virus) test. STI (sexually transmitted infection) testing, if you are at risk. Lung cancer screening. Colorectal cancer screening. Mammogram. Talk with your health care provider about when you should start having regular mammograms. This may depend on whether you have a family history of breast cancer. BRCA-related cancer screening. This may be done if you have a family history of breast, ovarian, tubal, or peritoneal cancers. Bone density scan. This is done to screen for osteoporosis. Talk with your health care provider about your test results, treatment options, and if necessary, the need for more tests. Follow these instructions at home: Eating and drinking  Eat a diet that includes fresh fruits and vegetables, whole grains, lean protein, and low-fat dairy products. Take vitamin and mineral supplements as recommended by your health care provider. Do not drink alcohol if: Your health care provider tells you not to drink. You are pregnant, may be pregnant, or are planning to become pregnant. If you drink alcohol: Limit how much you have to 0-1 drink a day. Know how much alcohol is in your drink. In the [place] one drink equals one 12 oz bottle of beer (355 mL), one 5 oz glass of wine (148 mL), or one 1 oz glass of hard liquor (44 mL). Lifestyle Brush your teeth every morning and night with fluoride toothpaste. Floss one time each day. Exercise for at least 30 minutes 5 or more days each week. Do not use any products that contain nicotine  or tobacco. These products include cigarettes, chewing tobacco, and vaping devices, such as e-cigarettes. If you need help quitting, ask your health care provider. Do not use drugs. If you are sexually active, practice safe sex. Use a condom or other form of protection to  prevent STIs. If you do not wish to become pregnant, use a form of birth control. If you plan to become pregnant, see your health care provider for a  prepregnancy visit. Take aspirin only as told by your health care provider. Make sure that you understand how much to take and what form to take. Work with your health care provider to find out whether it is safe and beneficial for you to take aspirin daily. Find healthy ways to manage stress, such as: Meditation, yoga, or listening to music. Journaling. Talking to a trusted person. Spending time with friends and family. Minimize exposure to UV radiation to reduce your risk of skin cancer. Safety Always wear your seat belt while driving or riding in a vehicle. Do not drive: If you have been drinking alcohol. Do not ride with someone who has been drinking. When you are tired or distracted. While texting. If you have been using any mind-altering substances or drugs. Wear a helmet and other protective equipment during sports activities. If you have firearms in your house, make sure you follow all gun safety procedures. Seek help if you have been physically or sexually abused. What's next? Visit your health care provider once a year for an annual wellness visit. Ask your health care provider how often you should have your eyes and teeth checked. Stay up to date on all vaccines. This information is not intended to replace advice given to you by your health care provider. Make sure you discuss any questions you have with your health care provider. Document Revised: 09/23/2020 Document Reviewed: 09/23/2020 Elsevier Patient Education  2024 [employer].   Tdap (Tetanus, Diphtheria, Pertussis) Vaccine: What You Need to Know Many vaccine information statements are available in Spanish and other languages. See PromoAge.com.br. 1. Why get vaccinated? Tdap vaccine can prevent tetanus, diphtheria, and pertussis. Diphtheria and pertussis spread from person to person. Tetanus enters the body through cuts or wounds. TETANUS (T) causes painful stiffening of the muscles. Tetanus can lead to  serious health problems, including being unable to open the mouth, having trouble swallowing and breathing, or death. DIPHTHERIA (D) can lead to difficulty breathing, heart failure, paralysis, or death. PERTUSSIS (aP), also known as whooping cough, can cause uncontrollable, violent coughing that makes it hard to breathe, eat, or drink. Pertussis can be extremely serious especially in babies and young children, causing pneumonia, convulsions, brain damage, or death. In teens and adults, it can cause weight loss, loss of bladder control, passing out, and rib fractures from severe coughing. 2. Tdap vaccine Tdap is only for children 7 years and older, adolescents, and adults.  Adolescents should receive a single dose of Tdap, preferably at age 20 or 12 years. Pregnant people should get a dose of Tdap during every pregnancy, preferably during the early part of the third trimester, to help protect the newborn from pertussis. Infants are most at risk for severe, life-threatening complications from pertussis. Adults who have never received Tdap should get a dose of Tdap. Also, adults should receive a booster dose of either Tdap or Td (a different vaccine that protects against tetanus and diphtheria but not pertussis) every 10 years, or after 5 years in the case of a severe or dirty wound or burn. Tdap may be given at the same time as other vaccines. 3. Talk with your health care provider Tell your vaccine provider if the person getting the vaccine: Has had an allergic reaction after a previous dose of any vaccine that protects against tetanus,  diphtheria, or pertussis, or has any severe, life-threatening allergies Has had a coma, decreased level of consciousness, or prolonged seizures within 7 days after a previous dose of any pertussis vaccine (DTP, DTaP, or Tdap) Has seizures or another nervous system problem Has ever had Guillain-Barr Syndrome (also called GBS) Has had severe pain or swelling after a  previous dose of any vaccine that protects against tetanus or diphtheria In some cases, your health care provider may decide to postpone Tdap vaccination until a future visit. People with minor illnesses, such as a cold, may be vaccinated. People who are moderately or severely ill should usually wait until they recover before getting Tdap vaccine.  Your health care provider can give you more information. 4. Risks of a vaccine reaction Pain, redness, or swelling where the shot was given, mild fever, headache, feeling tired, and nausea, vomiting, diarrhea, or stomachache sometimes happen after Tdap vaccination. People sometimes faint after medical procedures, including vaccination. Tell your provider if you feel dizzy or have vision changes or ringing in the ears.  As with any medicine, there is a very remote chance of a vaccine causing a severe allergic reaction, other serious injury, or death. 5. What if there is a serious problem? An allergic reaction could occur after the vaccinated person leaves the clinic. If you see signs of a severe allergic reaction (hives, swelling of the face and throat, difficulty breathing, a fast heartbeat, dizziness, or weakness), call 9-1-1 and get the person to the nearest hospital. For other signs that concern you, call your health care provider.  Adverse reactions should be reported to the Vaccine Adverse Event Reporting System (VAERS). Your health care provider will usually file this report, or you can do it yourself. Visit the VAERS website at www.vaers.LAgents.no or call (959) 256-6185. VAERS is only for reporting reactions, and VAERS staff members do not give medical advice. 6. The National Vaccine Injury Compensation Program The [employer] Vaccine Injury Compensation Program (VICP) is a federal program that was created to compensate people who may have been injured by certain vaccines. Claims regarding alleged injury or death due to vaccination have a time limit for  filing, which may be as short as two years. Visit the VICP website at SpiritualWord.at or call 684-182-1401 to learn about the program and about filing a claim. 7. How can I learn more? Ask your health care provider. Call your local or state health department. Visit the website of the Food and Drug Administration (FDA) for vaccine package inserts and additional information at FinderList.no. Contact the Centers for Disease Control and Prevention (CDC): Call 820-491-3517 (1-800-CDC-INFO) or Visit CDC's website at PicCapture.uy. Source: CDC Vaccine Information Statement Tdap (Tetanus, Diphtheria, Pertussis) Vaccine (11/15/2019) This same material is available at FootballExhibition.com.br for no charge. This information is not intended to replace advice given to you by your health care provider. Make sure you discuss any questions you have with your health care provider. Document Revised: 07/13/2022 Document Reviewed: 05/13/2022 Elsevier Patient Education  2024 [employer].

## 2023-10-11 ENCOUNTER — Telehealth: Payer: Self-pay

## 2023-10-11 NOTE — Telephone Encounter (Signed)
 SABRA

## 2023-10-11 NOTE — Telephone Encounter (Signed)
 Pre-Authorization has been sent. KW  pa preview Tasha Rodriguez ([name]) - [id number] Wegovy  2.4MG /0.75ML auto-injectors status: PA RequestCreated: June 26th, 2025 [phone]: July 2nd, 2025 Open

## 2023-10-11 NOTE — Telephone Encounter (Signed)
 Tasha Rodriguez

## 2023-10-11 NOTE — Telephone Encounter (Signed)
 Will work on pre-authorization through cover my meds. KW

## 2023-11-08 ENCOUNTER — Encounter: Payer: Self-pay | Admitting: Certified Nurse Midwife

## 2023-11-08 ENCOUNTER — Ambulatory Visit: Admitting: Certified Nurse Midwife

## 2023-11-08 VITALS — BP 102/70 | HR 79 | Ht 69.0 in | Wt 206.2 lb

## 2023-11-08 DIAGNOSIS — E669 Obesity, unspecified: Secondary | ICD-10-CM

## 2023-11-08 DIAGNOSIS — Z6830 Body mass index (BMI) 30.0-30.9, adult: Secondary | ICD-10-CM | POA: Diagnosis not present

## 2023-11-08 DIAGNOSIS — Z7689 Persons encountering health services in other specified circumstances: Secondary | ICD-10-CM | POA: Diagnosis not present

## 2023-11-08 MED ORDER — SEMAGLUTIDE-WEIGHT MANAGEMENT 2.4 MG/0.75ML ~~LOC~~ SOAJ: 2.4000 mg | SUBCUTANEOUS | 0 refills | Status: DC

## 2023-11-08 NOTE — Progress Notes (Signed)
 SUBJECTIVE:  43 y.o. here for follow-up weight loss visit, previously seen 4 weeks ago. Denies any concerns and feels like medication is working well. She has lost 5 lbs this past month . She has been experiencing some hair loss. But notes she has new hair growth.   OBJECTIVE:  BP 102/70   Pulse 79   Ht 5' 9 (1.753 m)   Wt 206 lb 3.2 oz (93.5 kg)   LMP 10/18/2023 (Exact Date)   BMI 30.45 kg/m   Body mass index is 30.45 kg/m.  Waist 37.25 Patient appears well. ASSESSMENT:  Obesity- responding well to weight loss plan PLAN:  To continue with current medications. B12 1000mcg/ml injection given RTC in 4 weeks as planned  Zelda Hummer, CNM

## 2023-12-01 ENCOUNTER — Encounter: Payer: Self-pay | Admitting: Certified Nurse Midwife

## 2023-12-01 MED ORDER — SEMAGLUTIDE-WEIGHT MANAGEMENT 2.4 MG/0.75ML ~~LOC~~ SOAJ: 2.4000 mg | SUBCUTANEOUS | 0 refills | Status: DC

## 2023-12-13 NOTE — Progress Notes (Unsigned)
       SUBJECTIVE:  43 y.o. here for follow-up weight loss visit, previously seen 4 weeks ago. Denies any concerns and feels like medication is working well. She is on the maintenance dose. She is still working on reaching her goal but denies any issues with the medication.    OBJECTIVE:  BP 103/70   Pulse 70   Ht 5' 9 (1.753 m)   Wt 203 lb 1.6 oz (92.1 kg)   BMI 29.99 kg/m   Body mass index is 29.99 kg/m. Patient appears well.  ASSESSMENT:  Obesity- responding well to weight loss plan  PLAN:  To continue with current medications. Refill x 3 months  RTC in 3 months as planned or PRN.   Tasha Rodriguez, CNM

## 2023-12-14 ENCOUNTER — Encounter: Payer: Self-pay | Admitting: Certified Nurse Midwife

## 2023-12-14 ENCOUNTER — Ambulatory Visit: Admitting: Certified Nurse Midwife

## 2023-12-14 VITALS — BP 103/70 | HR 70 | Ht 69.0 in | Wt 203.1 lb

## 2023-12-14 DIAGNOSIS — E669 Obesity, unspecified: Secondary | ICD-10-CM | POA: Diagnosis not present

## 2023-12-14 DIAGNOSIS — Z6829 Body mass index (BMI) 29.0-29.9, adult: Secondary | ICD-10-CM

## 2023-12-14 DIAGNOSIS — Z7689 Persons encountering health services in other specified circumstances: Secondary | ICD-10-CM

## 2023-12-14 MED ORDER — SEMAGLUTIDE-WEIGHT MANAGEMENT 2.4 MG/0.75ML ~~LOC~~ SOAJ: 2.4000 mg | SUBCUTANEOUS | 0 refills | Status: DC

## 2023-12-14 MED ORDER — CYANOCOBALAMIN 1000 MCG/ML IJ SOLN
1000.0000 ug | Freq: Once | INTRAMUSCULAR | Status: AC
  Administered 2023-12-14: 1000 ug via INTRAMUSCULAR

## 2023-12-14 NOTE — Patient Instructions (Signed)

## 2024-03-22 ENCOUNTER — Telehealth: Payer: Self-pay

## 2024-03-22 ENCOUNTER — Encounter: Payer: Self-pay | Admitting: Certified Nurse Midwife

## 2024-03-22 DIAGNOSIS — Z7689 Persons encountering health services in other specified circumstances: Secondary | ICD-10-CM

## 2024-03-22 MED ORDER — SEMAGLUTIDE-WEIGHT MANAGEMENT 2.4 MG/0.75ML ~~LOC~~ SOAJ: 2.4000 mg | SUBCUTANEOUS | 0 refills | Status: AC

## 2024-03-22 NOTE — Telephone Encounter (Signed)
 Pt sent message for a refill on her wegovy  shot she needed 2 more to last until her appointment.

## 2024-04-05 ENCOUNTER — Encounter: Payer: Self-pay | Admitting: Certified Nurse Midwife

## 2024-04-05 ENCOUNTER — Ambulatory Visit: Admitting: Certified Nurse Midwife

## 2024-04-05 VITALS — BP 73/63 | HR 86 | Ht 69.0 in | Wt 192.5 lb

## 2024-04-05 DIAGNOSIS — R031 Nonspecific low blood-pressure reading: Secondary | ICD-10-CM

## 2024-04-05 DIAGNOSIS — Z6828 Body mass index (BMI) 28.0-28.9, adult: Secondary | ICD-10-CM

## 2024-04-05 DIAGNOSIS — Z713 Dietary counseling and surveillance: Secondary | ICD-10-CM

## 2024-04-05 DIAGNOSIS — E669 Obesity, unspecified: Secondary | ICD-10-CM | POA: Diagnosis not present

## 2024-04-05 DIAGNOSIS — R5383 Other fatigue: Secondary | ICD-10-CM | POA: Diagnosis not present

## 2024-04-05 DIAGNOSIS — Z7689 Persons encountering health services in other specified circumstances: Secondary | ICD-10-CM

## 2024-04-05 NOTE — Progress Notes (Signed)
"  ° °  SUBJECTIVE:  43 y.o. here for follow-up weight loss visit, previously seen 4 weeks ago. Denies any concerns and feels like medication is working however, she has been informed that her insurance is no longer covering the Glp-1 for weight loss. She is inquiring about the need to taper off of the medication  OBJECTIVE:  BP (!) 73/63   Pulse 86   Ht 5' 9 (1.753 m)   Wt 192 lb 8 oz (87.3 kg)   BMI 28.43 kg/m   Body mass index is 28.43 kg/m. Patient appears well. States she is feeling a little tiered but otherwise feels normal.   ASSESSMENT:  Obesity- cessation of medication due to cost.                           :  Low blood pressure, fatigue  PLAN:  Discussed that there is not a need to taper down the Glp-1 dose . CBC to evaluate for anemia.  Will follow up with results.   Follow up for annual or prn.   Tasha Rodriguez , CNM "

## 2024-04-05 NOTE — Patient Instructions (Signed)

## 2024-04-06 LAB — CBC
Hematocrit: 42.1 % (ref 34.0–46.6)
Hemoglobin: 14.1 g/dL (ref 11.1–15.9)
MCH: 31.4 pg (ref 26.6–33.0)
MCHC: 33.5 g/dL (ref 31.5–35.7)
MCV: 94 fL (ref 79–97)
Platelets: 216 x10E3/uL (ref 150–450)
RBC: 4.49 x10E6/uL (ref 3.77–5.28)
RDW: 12.1 % (ref 11.7–15.4)
WBC: 6.7 x10E3/uL (ref 3.4–10.8)

## 2024-04-07 ENCOUNTER — Encounter: Payer: Self-pay | Admitting: Certified Nurse Midwife

## 2024-04-15 NOTE — Telephone Encounter (Signed)
 Tasha Rodriguez
